# Patient Record
Sex: Female | Born: 1975 | Race: White | Hispanic: No | Marital: Married | Smoking: Never smoker
Health system: Southern US, Community
[De-identification: ages and names within clinical notes are randomized; demographics above are authoritative.]

## PROBLEM LIST (undated history)

## (undated) DIAGNOSIS — Z9889 Other specified postprocedural states: Secondary | ICD-10-CM

## (undated) DIAGNOSIS — J45909 Unspecified asthma, uncomplicated: Secondary | ICD-10-CM

## (undated) DIAGNOSIS — F329 Major depressive disorder, single episode, unspecified: Secondary | ICD-10-CM

## (undated) DIAGNOSIS — D509 Iron deficiency anemia, unspecified: Secondary | ICD-10-CM

## (undated) DIAGNOSIS — F32A Depression, unspecified: Secondary | ICD-10-CM

## (undated) DIAGNOSIS — T7840XA Allergy, unspecified, initial encounter: Secondary | ICD-10-CM

## (undated) DIAGNOSIS — E161 Other hypoglycemia: Secondary | ICD-10-CM

## (undated) DIAGNOSIS — E162 Hypoglycemia, unspecified: Secondary | ICD-10-CM

## (undated) DIAGNOSIS — R112 Nausea with vomiting, unspecified: Secondary | ICD-10-CM

## (undated) DIAGNOSIS — E039 Hypothyroidism, unspecified: Secondary | ICD-10-CM

## (undated) HISTORY — DX: Unspecified asthma, uncomplicated: J45.909

## (undated) HISTORY — DX: Hypothyroidism, unspecified: E03.9

## (undated) HISTORY — DX: Major depressive disorder, single episode, unspecified: F32.9

## (undated) HISTORY — DX: Iron deficiency anemia, unspecified: D50.9

## (undated) HISTORY — DX: Depression, unspecified: F32.A

## (undated) HISTORY — PX: GASTRIC BYPASS: SHX52

## (undated) HISTORY — DX: Hypoglycemia, unspecified: E16.2

## (undated) HISTORY — DX: Allergy, unspecified, initial encounter: T78.40XA

## (undated) HISTORY — DX: Other hypoglycemia: E16.1

---

## 2005-01-12 ENCOUNTER — Ambulatory Visit: Payer: Self-pay | Admitting: Family Medicine

## 2005-01-14 ENCOUNTER — Ambulatory Visit: Payer: Self-pay | Admitting: Family Medicine

## 2005-02-01 ENCOUNTER — Ambulatory Visit: Payer: Self-pay | Admitting: Family Medicine

## 2005-04-29 ENCOUNTER — Ambulatory Visit: Payer: Self-pay | Admitting: Unknown Physician Specialty

## 2006-12-19 ENCOUNTER — Encounter: Payer: Self-pay | Admitting: Maternal & Fetal Medicine

## 2007-06-20 ENCOUNTER — Ambulatory Visit: Payer: Self-pay

## 2007-06-21 ENCOUNTER — Inpatient Hospital Stay: Payer: Self-pay

## 2009-02-24 ENCOUNTER — Encounter: Payer: Self-pay | Admitting: Obstetrics and Gynecology

## 2009-04-10 ENCOUNTER — Observation Stay: Payer: Self-pay

## 2009-04-14 ENCOUNTER — Observation Stay: Payer: Self-pay

## 2009-04-17 ENCOUNTER — Observation Stay: Payer: Self-pay | Admitting: Unknown Physician Specialty

## 2009-04-21 ENCOUNTER — Encounter: Payer: Self-pay | Admitting: Obstetrics and Gynecology

## 2009-04-21 ENCOUNTER — Observation Stay: Payer: Self-pay

## 2009-04-24 ENCOUNTER — Observation Stay: Payer: Self-pay

## 2009-04-28 ENCOUNTER — Ambulatory Visit: Payer: Self-pay

## 2009-04-29 ENCOUNTER — Inpatient Hospital Stay: Payer: Self-pay

## 2009-09-04 ENCOUNTER — Ambulatory Visit: Payer: Self-pay | Admitting: Family Medicine

## 2009-09-04 DIAGNOSIS — M25569 Pain in unspecified knee: Secondary | ICD-10-CM

## 2009-09-04 DIAGNOSIS — E039 Hypothyroidism, unspecified: Secondary | ICD-10-CM | POA: Insufficient documentation

## 2009-09-04 DIAGNOSIS — F325 Major depressive disorder, single episode, in full remission: Secondary | ICD-10-CM

## 2009-09-04 DIAGNOSIS — J301 Allergic rhinitis due to pollen: Secondary | ICD-10-CM

## 2009-09-04 DIAGNOSIS — F331 Major depressive disorder, recurrent, moderate: Secondary | ICD-10-CM | POA: Insufficient documentation

## 2009-09-04 DIAGNOSIS — D509 Iron deficiency anemia, unspecified: Secondary | ICD-10-CM

## 2009-09-04 DIAGNOSIS — J45909 Unspecified asthma, uncomplicated: Secondary | ICD-10-CM | POA: Insufficient documentation

## 2009-09-23 ENCOUNTER — Encounter: Payer: Self-pay | Admitting: Family Medicine

## 2009-10-20 ENCOUNTER — Ambulatory Visit: Payer: Self-pay | Admitting: Family Medicine

## 2009-10-20 DIAGNOSIS — J069 Acute upper respiratory infection, unspecified: Secondary | ICD-10-CM | POA: Insufficient documentation

## 2010-01-28 ENCOUNTER — Ambulatory Visit: Payer: Self-pay | Admitting: Family Medicine

## 2010-01-28 DIAGNOSIS — J019 Acute sinusitis, unspecified: Secondary | ICD-10-CM

## 2010-01-28 DIAGNOSIS — R51 Headache: Secondary | ICD-10-CM

## 2010-01-28 DIAGNOSIS — R519 Headache, unspecified: Secondary | ICD-10-CM | POA: Insufficient documentation

## 2010-02-10 ENCOUNTER — Ambulatory Visit: Payer: Self-pay

## 2011-01-05 NOTE — Assessment & Plan Note (Signed)
Summary: 9:15 ?SINUS INFECTION/CLE   Vital Signs:  Patient profile:   35 year old female Height:      68 inches Weight:      289.38 pounds BMI:     44.16 Temp:     97.8 degrees F oral Pulse rate:   72 / minute Pulse rhythm:   regular BP sitting:   104 / 72  (left arm) Cuff size:   large  Vitals Entered By: Linde Gillis CMA Duncan Dull) (January 28, 2010 9:36 AM) CC: ? sinus infection   History of Present Illness: 35 year old female:  Two weeks ago got a cold, then sunday head really started to hurt. Not helping.   No fever. Three little children.   R sided facial pain and ear  Acute Visit History:      The patient complains of cough, earache, headache, nasal discharge, and sinus problems.  These symptoms began 2 weeks ago.  She denies abdominal pain, chest pain, diarrhea, eye symptoms, fever, genitourinary symptoms, musculoskeletal symptoms, nausea, rash, and sore throat.        There is no history of wheezing, sleep interference, shortness of breath, respiratory retractions, tachypnea, cyanosis, or interference with oral intake associated with her cough.        The earache is located on the right side.  There have been 'cold' or URI symptoms associated with the earache.  There is no history of recent antibiotic usage or recurrent otitis media associated with the earache.        She complains of sinus pressure, teeth aching, ears being blocked, nasal congestion, purulent drainage, and frontal headache.  The patient has had a past history of sinusitis.        Urine output has been normal.  She is tolerating clear liquids.        Allergies (verified): No Known Drug Allergies  Past History:  Past medical, surgical, family and social histories (including risk factors) reviewed, and no changes noted (except as noted below).  Past Medical History: Reviewed history from 09/04/2009 and no changes required. Current Problems:  THYROID DISORDER (ICD-246.9) BLOOD TRANSFUSION WITHOUT  REPORTED DIAGNOSIS (ICD-V58.2) ALLERGIC RHINITIS DUE TO POLLEN (ICD-477.0) DEPRESSION (ICD-311) ASTHMA (ICD-493.90) ANEMIA (ICD-285.9)    Past Surgical History: Reviewed history from 09/04/2009 and no changes required. 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 Family History of Arthritis Family History Diabetes: MGM Family History Ovarian cancer  Father: limited contact mother: ovarian cancer 1 brother, 3 sisters: health PGM and MGM : Alzheimers  Social History: Reviewed history from 09/04/2009 and no changes required. Occupation: Conservation officer, historic buildings Married Never Smoked Alcohol use-no Drug use-no Regular exercise-no  Review of Systems       REVIEW OF SYSTEMS GEN: Acute illness details above. CV: No chest pain or SOB GI: No noted N or V Otherwise, pertinent positives and negatives are noted in the HPI.   Physical Exam  Additional Exam:  Gen: WDWN, NAD; alert,appropriate and cooperative throughout exam  HEENT: Normocephalic and atraumatic. Throat clear, w/o exudate, no LAD, R TM clear, L TM - good landmarks, No fluid present. rhinnorhea.  Left frontal and maxillary sinuses: Tender, essentially NT Right frontal and maxillary sinuses: Tender greatest at max  Neck: No ant or post LAD  CV: RRR, No M/G/R  Pulm: Breathing comfortably in no resp distress. no w/c/r  Abd: S,NT,ND,+BS  Extr: no c/c/e  Psych: full affect, pleasant  Impression & Recommendations:  Problem # 1:  SINUSITIS - ACUTE-NOS (ICD-461.9) Assessment New  Her updated medication list for this problem includes:    Amoxicillin 500 Mg Caps (Amoxicillin) .Marland KitchenMarland KitchenMarland KitchenMarland Kitchen 3 tabs by mouth two times a day for 10 days  Problem # 2:  HEADACHE (ICD-784.0) Assessment: New  Complete Medication List: 1)  P D Natal Vitamins/folic Acid Tabs (Prenatal multivit-min-fe-fa) .Marland Kitchen.. 1 tab by mouth daily 2)  Synthroid 50 Mcg Tabs  (Levothyroxine sodium) .... Take 1 tablet by mouth once a day 3)  Amoxicillin 500 Mg Caps (Amoxicillin) .... 3 tabs by mouth two times a day for 10 days 4)  Diflucan 150 Mg Tabs (Fluconazole) .... Once daily  Patient Instructions: 1)  AFRIN SPRAY 2)  SINUSITIS 3)  Sinuses are cavities in facial skeleton that drain to nose. Impaired drainage and obstruction of sinus passages main cause. 4)  Treatment: 5)  1. Take all Antibiotics 6)  2. Open nasal and sinus canals: Oral decongestant: Sudafed. (CAUTION IF HIGH BLOOD PRESSURE) 7)  3. Steam inhalation 8)  4. Humidifier in room 9)  5. Frequent nasal saline irrigation 10)  6. Moist heat compresses to face 11)  7. Tylenol or Ibuprofen for pain and fever, follow directions on bottle.  Prescriptions: DIFLUCAN 150 MG TABS (FLUCONAZOLE) once daily  #1 x 0   Entered and Authorized by:   Hannah Beat MD   Signed by:   Hannah Beat MD on 01/28/2010   Method used:   Electronically to        [facility]       [address]       [place], [place]  [address]       Ph: [phone]       Fax: [phone]   RxID:   [id number] AMOXICILLIN 500 MG CAPS (AMOXICILLIN) 3 tabs by mouth two times a day for 10 days  #60 x 0   Entered and Authorized by:   Hannah Beat MD   Signed by:   Hannah Beat MD on 01/28/2010   Method used:   Electronically to        [facility]       [address]       [place], [place]  [address]       Ph: [phone]       Fax: [phone]   RxID:   [id number]   Current Allergies (reviewed today): No known allergies

## 2012-07-11 ENCOUNTER — Ambulatory Visit: Payer: Self-pay | Admitting: Internal Medicine

## 2012-07-11 LAB — CBC CANCER CENTER
Basophil #: 0 x10 3/mm (ref 0.0–0.1)
Basophil %: 0.7 %
Eosinophil #: 0.1 x10 3/mm (ref 0.0–0.7)
Eosinophil %: 1.2 %
HCT: 27.6 % — ABNORMAL LOW (ref 35.0–47.0)
HGB: 8.4 g/dL — ABNORMAL LOW (ref 12.0–16.0)
Lymphocyte #: 1.2 x10 3/mm (ref 1.0–3.6)
Lymphocyte %: 23.9 %
MCH: 19.4 pg — ABNORMAL LOW (ref 26.0–34.0)
MCHC: 30.3 g/dL — ABNORMAL LOW (ref 32.0–36.0)
MCV: 64 fL — ABNORMAL LOW (ref 80–100)
Monocyte #: 0.3 x10 3/mm (ref 0.2–0.9)
Monocyte %: 6.4 %
Neutrophil #: 3.5 x10 3/mm (ref 1.4–6.5)
Neutrophil %: 67.8 %
Platelet: 293 x10 3/mm (ref 150–440)
RBC: 4.31 10*6/uL (ref 3.80–5.20)
RDW: 20.8 % — ABNORMAL HIGH (ref 11.5–14.5)
WBC: 5.1 x10 3/mm (ref 3.6–11.0)

## 2012-07-11 LAB — IRON AND TIBC
Iron Bind.Cap.(Total): 422 ug/dL (ref 250–450)
Iron Saturation: 16 %
Iron: 66 ug/dL (ref 50–170)

## 2012-07-11 LAB — RETICULOCYTES
Absolute Retic Count: 0.055 10*6/uL (ref 0.023–0.096)
Reticulocyte: 1.3 % (ref 0.5–2.2)

## 2012-07-11 LAB — FERRITIN: Ferritin (ARMC): 2 ng/mL — ABNORMAL LOW (ref 8–388)

## 2012-08-06 ENCOUNTER — Ambulatory Visit: Payer: Self-pay | Admitting: Internal Medicine

## 2012-08-21 LAB — OCCULT BLOOD X 1 CARD TO LAB, STOOL
Occult Blood, Feces: NEGATIVE
Occult Blood, Feces: NEGATIVE
Occult Blood, Feces: NEGATIVE

## 2012-09-05 ENCOUNTER — Ambulatory Visit: Payer: Self-pay | Admitting: Internal Medicine

## 2012-10-06 ENCOUNTER — Ambulatory Visit: Payer: Self-pay | Admitting: Internal Medicine

## 2014-02-27 ENCOUNTER — Ambulatory Visit (INDEPENDENT_AMBULATORY_CARE_PROVIDER_SITE_OTHER): Payer: 59 | Admitting: Podiatry

## 2014-02-27 ENCOUNTER — Encounter: Payer: Self-pay | Admitting: Podiatry

## 2014-02-27 VITALS — BP 116/69 | HR 64 | Resp 16 | Ht 67.0 in | Wt 325.0 lb

## 2014-02-27 DIAGNOSIS — L608 Other nail disorders: Secondary | ICD-10-CM

## 2014-02-27 NOTE — Progress Notes (Signed)
Want him to take a look at my toenails , i noticed after my last pedicure. The toenails are discolored. Right great kind of hurts. Tried over the counter stuff and vicks vapor rub.  Objective: Vital signs are stable she is alert and oriented x3. Pulses are palpable bilateral. She has thickening and discoloration of nails 1 through 5 of the right foot. None the left foot.  Assessment: Nail dystrophy right foot.  Plan: We took clippings today and since it out for pathology and culture. I will followup with her once those come in.

## 2014-04-11 ENCOUNTER — Ambulatory Visit: Payer: Self-pay | Admitting: Internal Medicine

## 2014-04-25 ENCOUNTER — Emergency Department: Payer: Self-pay | Admitting: Emergency Medicine

## 2014-04-25 LAB — TROPONIN I: Troponin-I: <0.02 ng/mL

## 2014-04-25 LAB — CBC WITH DIFFERENTIAL/PLATELET
BASOS ABS: 0 10*3/uL (ref 0.0–0.1)
Basophil %: 0.7 %
EOS ABS: 0.2 10*3/uL (ref 0.0–0.7)
EOS PCT: 2.9 %
HCT: 34.2 % — AB (ref 35.0–47.0)
HGB: 10.3 g/dL — AB (ref 12.0–16.0)
Lymphocyte #: 2.1 10*3/uL (ref 1.0–3.6)
Lymphocyte %: 32.7 %
MCH: 21.3 pg — AB (ref 26.0–34.0)
MCHC: 30.2 g/dL — AB (ref 32.0–36.0)
MCV: 71 fL — AB (ref 80–100)
MONO ABS: 0.4 x10 3/mm (ref 0.2–0.9)
Monocyte %: 6.8 %
Neutrophil #: 3.6 10*3/uL (ref 1.4–6.5)
Neutrophil %: 56.9 %
PLATELETS: 264 10*3/uL (ref 150–440)
RBC: 4.85 10*6/uL (ref 3.80–5.20)
RDW: 26.3 % — AB (ref 11.5–14.5)
WBC: 6.4 10*3/uL (ref 3.6–11.0)

## 2014-04-25 LAB — BASIC METABOLIC PANEL
Anion Gap: 9 (ref 7–16)
BUN: 11 mg/dL (ref 7–18)
CREATININE: 0.67 mg/dL (ref 0.60–1.30)
Calcium, Total: 8.1 mg/dL — ABNORMAL LOW (ref 8.5–10.1)
Chloride: 106 mmol/L (ref 98–107)
Co2: 22 mmol/L (ref 21–32)
EGFR (African American): >60
EGFR (Non-African Amer.): >60
GLUCOSE: 90 mg/dL (ref 65–99)
Osmolality: 273 (ref 275–301)
Potassium: 4.7 mmol/L (ref 3.5–5.1)
SODIUM: 137 mmol/L (ref 136–145)

## 2014-04-25 LAB — URINALYSIS, COMPLETE
BLOOD: NEGATIVE
Bilirubin,UR: NEGATIVE
Glucose,UR: NEGATIVE mg/dL (ref 0–75)
Ketone: NEGATIVE
NITRITE: NEGATIVE
Ph: 7 (ref 4.5–8.0)
Protein: NEGATIVE
SPECIFIC GRAVITY: 1.011 (ref 1.003–1.030)
WBC UR: 13 /HPF (ref 0–5)

## 2014-04-25 LAB — CREATININE, SERUM: Creatine, Serum: 0.67

## 2014-05-06 ENCOUNTER — Ambulatory Visit: Payer: Self-pay | Admitting: Internal Medicine

## 2014-05-20 ENCOUNTER — Encounter: Payer: Self-pay | Admitting: Podiatry

## 2014-05-20 ENCOUNTER — Ambulatory Visit (INDEPENDENT_AMBULATORY_CARE_PROVIDER_SITE_OTHER): Payer: 59 | Admitting: Podiatry

## 2014-05-20 ENCOUNTER — Ambulatory Visit (INDEPENDENT_AMBULATORY_CARE_PROVIDER_SITE_OTHER): Payer: 59

## 2014-05-20 DIAGNOSIS — Z79899 Other long term (current) drug therapy: Secondary | ICD-10-CM

## 2014-05-20 DIAGNOSIS — M795 Residual foreign body in soft tissue: Secondary | ICD-10-CM

## 2014-05-20 LAB — CBC WITH DIFFERENTIAL/PLATELET
BASOS: 1 %
Basophils Absolute: 0 10*3/uL (ref 0.0–0.2)
EOS: 3 %
Eosinophils Absolute: 0.1 10*3/uL (ref 0.0–0.4)
HEMATOCRIT: 36 % (ref 34.0–46.6)
Hemoglobin: 11.3 g/dL (ref 11.1–15.9)
IMMATURE GRANS (ABS): 0 10*3/uL (ref 0.0–0.1)
IMMATURE GRANULOCYTES: 0 %
Lymphocytes Absolute: 1.2 10*3/uL (ref 0.7–3.1)
Lymphs: 26 %
MCH: 23.4 pg — ABNORMAL LOW (ref 26.6–33.0)
MCHC: 31.4 g/dL — ABNORMAL LOW (ref 31.5–35.7)
MCV: 75 fL — AB (ref 79–97)
MONOCYTES: 7 %
MONOS ABS: 0.3 10*3/uL (ref 0.1–0.9)
NEUTROS PCT: 63 %
Neutrophils Absolute: 2.9 10*3/uL (ref 1.4–7.0)
RBC: 4.83 x10E6/uL (ref 3.77–5.28)
RDW: 26.1 % — ABNORMAL HIGH (ref 12.3–15.4)
WBC: 4.5 10*3/uL (ref 3.4–10.8)

## 2014-05-20 MED ORDER — TERBINAFINE HCL 250 MG PO TABS: 250.0000 mg | ORAL_TABLET | Freq: Every day | ORAL | Status: DC

## 2014-05-20 NOTE — Progress Notes (Signed)
She presents today for followup of her fungal results. She's also complaining of a foreign object in the plantar aspect of her left foot. She states this been there for the past 2-3 days since be getting worse. She tried getting it out herself but she can't tolerate she states.  Objective: Vital signs are stable she is alert and oriented x3. Culture came back positive for fungus. Plantar aspect of the left foot does demonstrate an area of erythema with a central pustule. I evacuated the pustule today and debris the area deep to it and found a hair from a dog..  Assessment: Onychomycosis. Foreign body left foot.  Plan: Removal of foreign body today after Betadine skin prep and no local anesthetic was administered. Dispensed requisition for liver profile and CBC. Wrote a prescription for Lamisil 250 mg #30 one by mouth daily discussed the pros and cons of the use of Lamisil will followup with me in one month.

## 2014-05-21 LAB — HEPATIC FUNCTION PANEL
ALBUMIN: 3.8 g/dL (ref 3.5–5.5)
ALT: 9 IU/L (ref 0–32)
AST: 14 IU/L (ref 0–40)
Alkaline Phosphatase: 62 IU/L (ref 39–117)
BILIRUBIN TOTAL: 0.5 mg/dL (ref 0.0–1.2)
Bilirubin, Direct: 0.12 mg/dL (ref 0.00–0.40)
TOTAL PROTEIN: 6.4 g/dL (ref 6.0–8.5)

## 2014-05-22 ENCOUNTER — Telehealth: Payer: Self-pay | Admitting: *Deleted

## 2014-05-22 NOTE — Telephone Encounter (Signed)
Message copied by Bing ReeGUNN, Kristen Fromm L on Wed May 22, 2014  4:24 PM ------      Message from: Ernestene KielHYATT, MAX T      Created: Wed May 22, 2014 12:36 PM       Blood work is generally good and may continue with medication. ------

## 2014-05-22 NOTE — Telephone Encounter (Signed)
Called patient to inform her of her results, which was fine. Continue with medication

## 2014-05-24 LAB — OCCULT BLOOD X 1 CARD TO LAB, STOOL
Occult Blood, Feces: NEGATIVE
Occult Blood, Feces: NEGATIVE
Occult Blood, Feces: NEGATIVE

## 2014-06-05 ENCOUNTER — Ambulatory Visit: Payer: Self-pay | Admitting: Internal Medicine

## 2014-06-17 ENCOUNTER — Encounter: Payer: Self-pay | Admitting: Podiatry

## 2014-06-17 ENCOUNTER — Ambulatory Visit (INDEPENDENT_AMBULATORY_CARE_PROVIDER_SITE_OTHER): Payer: 59 | Admitting: Podiatry

## 2014-06-17 DIAGNOSIS — Z79899 Other long term (current) drug therapy: Secondary | ICD-10-CM

## 2014-06-17 MED ORDER — TERBINAFINE HCL 250 MG PO TABS: 250.0000 mg | ORAL_TABLET | Freq: Every day | ORAL | Status: DC

## 2014-06-17 NOTE — Progress Notes (Signed)
She presents today for followup of her Lamisil therapy. States she's been on for 1 month has denies fever chills nausea vomiting muscle aches pains and rashes from the medication.  Objective: Vital signs are stable she is alert and oriented x3 there is no erythema edema saline is drainage or odor. Nails do not appear to be changing as of yet.  Assessment: Onychomycosis  Plan: Another liver profile be performed today CBC. Prescription for 90 Lamisil tablets. I will followup with her in 4 months

## 2014-06-18 LAB — HEPATIC FUNCTION PANEL
ALT: 11 IU/L (ref 0–32)
AST: 15 IU/L (ref 0–40)
Albumin: 3.8 g/dL (ref 3.5–5.5)
Alkaline Phosphatase: 58 IU/L (ref 39–117)
BILIRUBIN DIRECT: 0.04 mg/dL (ref 0.00–0.40)
BILIRUBIN TOTAL: 0.3 mg/dL (ref 0.0–1.2)
Total Protein: 6.7 g/dL (ref 6.0–8.5)

## 2014-06-18 LAB — CBC WITH DIFFERENTIAL/PLATELET
BASOS ABS: 0 10*3/uL (ref 0.0–0.2)
Basos: 0 %
Eos: 3 %
Eosinophils Absolute: 0.2 10*3/uL (ref 0.0–0.4)
HEMATOCRIT: 37.5 % (ref 34.0–46.6)
HEMOGLOBIN: 11.8 g/dL (ref 11.1–15.9)
Immature Grans (Abs): 0 10*3/uL (ref 0.0–0.1)
Immature Granulocytes: 0 %
LYMPHS ABS: 1.8 10*3/uL (ref 0.7–3.1)
Lymphs: 36 %
MCH: 24.8 pg — ABNORMAL LOW (ref 26.6–33.0)
MCHC: 31.5 g/dL (ref 31.5–35.7)
MCV: 79 fL (ref 79–97)
Monocytes Absolute: 0.3 10*3/uL (ref 0.1–0.9)
Monocytes: 7 %
NEUTROS ABS: 2.7 10*3/uL (ref 1.4–7.0)
Neutrophils Relative %: 54 %
RBC: 4.76 x10E6/uL (ref 3.77–5.28)
RDW: 23.1 % — AB (ref 12.3–15.4)
WBC: 5 10*3/uL (ref 3.4–10.8)

## 2014-06-19 ENCOUNTER — Telehealth: Payer: Self-pay | Admitting: *Deleted

## 2014-06-19 NOTE — Telephone Encounter (Signed)
Called patient to let her know of her blood work results , left her a message

## 2014-06-19 NOTE — Telephone Encounter (Signed)
Message copied by Bing ReeGUNN, Ebony Yorio L on Wed Jun 19, 2014 11:54 AM ------      Message from: Ernestene KielHYATT, MAX T      Created: Tue Jun 18, 2014  7:30 AM       Blood work looks good.  Continue medication. ------

## 2014-07-03 ENCOUNTER — Encounter: Payer: Self-pay | Admitting: Podiatry

## 2014-10-14 ENCOUNTER — Ambulatory Visit: Payer: 59 | Admitting: Podiatry

## 2014-10-17 ENCOUNTER — Ambulatory Visit: Payer: Self-pay | Admitting: Internal Medicine

## 2014-11-05 ENCOUNTER — Ambulatory Visit: Payer: Self-pay | Admitting: Internal Medicine

## 2014-12-25 ENCOUNTER — Ambulatory Visit: Payer: Self-pay | Admitting: Internal Medicine

## 2014-12-31 ENCOUNTER — Ambulatory Visit: Payer: Self-pay | Admitting: Family Medicine

## 2015-01-07 ENCOUNTER — Encounter: Payer: Self-pay | Admitting: Family Medicine

## 2015-01-07 ENCOUNTER — Ambulatory Visit (INDEPENDENT_AMBULATORY_CARE_PROVIDER_SITE_OTHER): Payer: 59 | Admitting: Family Medicine

## 2015-01-07 ENCOUNTER — Encounter (INDEPENDENT_AMBULATORY_CARE_PROVIDER_SITE_OTHER): Payer: Self-pay

## 2015-01-07 VITALS — BP 96/70 | HR 60 | Temp 98.2°F | Ht 67.0 in | Wt 337.5 lb

## 2015-01-07 DIAGNOSIS — F3341 Major depressive disorder, recurrent, in partial remission: Secondary | ICD-10-CM

## 2015-01-07 DIAGNOSIS — E039 Hypothyroidism, unspecified: Secondary | ICD-10-CM

## 2015-01-07 DIAGNOSIS — D509 Iron deficiency anemia, unspecified: Secondary | ICD-10-CM

## 2015-01-07 MED ORDER — LEVOTHYROXINE SODIUM 100 MCG PO TABS: 100.0000 ug | ORAL_TABLET | Freq: Every day | ORAL | Status: DC

## 2015-01-07 NOTE — Assessment & Plan Note (Signed)
Follow once back on thyroid med. May need to treat.

## 2015-01-07 NOTE — Assessment & Plan Note (Signed)
Counseled on healthy eating, regular exercise

## 2015-01-07 NOTE — Patient Instructions (Addendum)
Restart levothyroxine, return for lab only visit for TSH in 2-4 weeks.  Get back on track with healthy eating and regular exercise. Schedule CPX with labs prior in 2-3 months.

## 2015-01-07 NOTE — Assessment & Plan Note (Signed)
Refill then re-eval on 100 mcg dose.

## 2015-01-07 NOTE — Progress Notes (Signed)
Pre visit review using our clinic review tool, if applicable. No additional management support is needed unless otherwise documented below in the visit note. 

## 2015-01-07 NOTE — Assessment & Plan Note (Signed)
Followed by heme.  

## 2015-01-07 NOTE — Progress Notes (Signed)
   Subjective:    Patient ID: Kelly BurkeSonja Z Barr, female    DOB: 04/02/1976, 39 y.o.   MRN: [id number]  HPI 39 year old female presetns to  Re-establish. Last seen here in 2010.[facility].  [facility] Dr. Dear.  Dure for CPX with pap/breast.  Hypothyroidism, poor control off medication in last few months: Due for re-eval and refill of med. She last had a change in med to increase last summer. Since being off noticed  10 lb weight gain and depression.  She is always cold.  She has been having iron def anemia: Seeing heme (Dr.Gittin) for iron infusions every 4 months.    Exercise: none  Diet: moderate  BP Readings from Last 3 Encounters:  01/07/15 96/70  02/27/14 116/69  01/28/10 104/72        Review of Systems  Constitutional: Negative for fever and fatigue.  HENT: Negative for ear pain.   Eyes: Negative for pain.  Respiratory: Negative for chest tightness and shortness of breath.   Cardiovascular: Negative for chest pain, palpitations and leg swelling.  Gastrointestinal: Negative for abdominal pain.  Genitourinary: Negative for dysuria.       Objective:   Physical Exam  Constitutional: Vital signs are normal. She appears well-developed and well-nourished. She is cooperative.  Non-toxic appearance. She does not appear ill. No distress.  Morbidly obese  HENT:  Head: Normocephalic.  Right Ear: Hearing, tympanic membrane, external ear and ear canal normal.  Left Ear: Hearing, tympanic membrane, external ear and ear canal normal.  Nose: Nose normal.  Eyes: Conjunctivae, EOM and lids are normal. Pupils are equal, round, and reactive to light. Lids are everted and swept, no foreign bodies found.  Neck: Trachea normal and normal range of motion. Neck supple. Carotid bruit is not present. No thyroid mass and no thyromegaly present.  Cardiovascular: Normal rate, regular rhythm, S1 normal, S2 normal, normal heart sounds and intact distal pulses.  Exam reveals no  gallop.   No murmur heard. Pulmonary/Chest: Effort normal and breath sounds normal. No respiratory distress. She has no wheezes. She has no rhonchi. She has no rales.  Abdominal: Soft. Normal appearance and bowel sounds are normal. She exhibits no distension, no fluid wave, no abdominal bruit and no mass. There is no hepatosplenomegaly. There is no tenderness. There is no rebound, no guarding and no CVA tenderness. No hernia.  Lymphadenopathy:    She has no cervical adenopathy.    She has no axillary adenopathy.  Neurological: She is alert. She has normal strength. No cranial nerve deficit or sensory deficit.  Skin: Skin is warm, dry and intact. No rash noted.  Psychiatric: Her speech is normal and behavior is normal. Judgment normal. Her mood appears not anxious. Cognition and memory are normal. She does not exhibit a depressed mood.          Assessment & Plan:

## 2015-02-20 ENCOUNTER — Encounter: Payer: Self-pay | Admitting: Family Medicine

## 2015-02-28 ENCOUNTER — Ambulatory Visit
Admit: 2015-02-28 | Disposition: A | Discharge status: Home / Self Care | Payer: Self-pay | Attending: Internal Medicine | Admitting: Internal Medicine

## 2015-03-07 ENCOUNTER — Ambulatory Visit
Admit: 2015-03-07 | Disposition: A | Discharge status: Home / Self Care | Payer: Self-pay | Attending: Internal Medicine | Admitting: Internal Medicine

## 2015-04-23 ENCOUNTER — Ambulatory Visit: Payer: 59 | Admitting: Internal Medicine

## 2015-04-29 ENCOUNTER — Encounter: Payer: 59 | Admitting: Family Medicine

## 2015-04-30 ENCOUNTER — Encounter: Payer: Self-pay | Admitting: Family Medicine

## 2015-05-06 ENCOUNTER — Ambulatory Visit (INDEPENDENT_AMBULATORY_CARE_PROVIDER_SITE_OTHER): Payer: 59 | Admitting: Family Medicine

## 2015-05-06 ENCOUNTER — Encounter: Payer: Self-pay | Admitting: Family Medicine

## 2015-05-06 VITALS — BP 110/72 | HR 60 | Temp 97.7°F | Ht 67.0 in | Wt 339.5 lb

## 2015-05-06 DIAGNOSIS — Z0000 Encounter for general adult medical examination without abnormal findings: Secondary | ICD-10-CM

## 2015-05-06 DIAGNOSIS — E039 Hypothyroidism, unspecified: Secondary | ICD-10-CM

## 2015-05-06 NOTE — Progress Notes (Signed)
Subjective:    Patient ID: Doreene BurkeSonja Z Shaw, female    DOB: 1976/07/15, 39 y.o.   MRN: [id number]  HPI  39 year old female presents for wellness visit.  Hypothyroid. Now on 100 mcg in last 3 months. 02/11/2015 free t3  and free t4 at goal. Less fatigue. No SE.   She received an iron transfusion in 02/2015 for anemia.  Hemetologist  [facility].  Plans repeat iron transfusion every 4 months.  Reviewed in detail. Recent CMET: glucose 91  Chol tot 150. hdl 57, LDL 84, trig 47  Wt Readings from Last 3 Encounters:  05/06/15 339 lb 8 oz (153.996 kg)  02/28/15 342 lb 2.5 oz (155.201 kg)  01/07/15 337 lb 8 oz (153.089 kg)   Diet: working on healthy eating. Snacks a lot, stress eating.  Exercise: Was doing well until feel 04/14/2015 when she fell. Has gradually improved. Trying to walk more with friend.   Asthma , mild intermittant: Well contorlle don no controller.   Review of Systems  Constitutional: Positive for fatigue. Negative for fever.  HENT: Negative for ear pain.   Eyes: Negative for pain.  Respiratory: Negative for chest tightness and shortness of breath.   Cardiovascular: Negative for chest pain, palpitations and leg swelling.  Gastrointestinal: Negative for abdominal pain.  Genitourinary: Negative for dysuria.       Objective:   Physical Exam  Constitutional: Vital signs are normal. She appears well-developed and well-nourished. She is cooperative.  Non-toxic appearance. She does not appear ill. No distress.  HENT:  Head: Normocephalic.  Right Ear: Hearing, tympanic membrane, external ear and ear canal normal.  Left Ear: Hearing, tympanic membrane, external ear and ear canal normal.  Nose: Nose normal.  Eyes: Conjunctivae, EOM and lids are normal. Pupils are equal, round, and reactive to light. Lids are everted and swept, no foreign bodies found.  Neck: Trachea normal and normal range of motion. Neck supple. Carotid bruit is not present. No thyroid mass and no  thyromegaly present.  Cardiovascular: Normal rate, regular rhythm, S1 normal, S2 normal, normal heart sounds and intact distal pulses.  Exam reveals no gallop.   No murmur heard. Pulmonary/Chest: Effort normal and breath sounds normal. No respiratory distress. She has no wheezes. She has no rhonchi. She has no rales.  Abdominal: Soft. Normal appearance and bowel sounds are normal. She exhibits no distension, no fluid wave, no abdominal bruit and no mass. There is no hepatosplenomegaly. There is no tenderness. There is no rebound, no guarding and no CVA tenderness. No hernia.  Genitourinary: Vagina normal and uterus normal. No breast swelling, tenderness, discharge or bleeding. Pelvic exam was performed with patient supine. There is no rash, tenderness or lesion on the right labia. There is no rash, tenderness or lesion on the left labia. Uterus is not enlarged and not tender. Right adnexum displays no mass, no tenderness and no fullness. Left adnexum displays no mass, no tenderness and no fullness.  NO PAP PERFORMED.  Lymphadenopathy:    She has no cervical adenopathy.    She has no axillary adenopathy.  Neurological: She is alert. She has normal strength. No cranial nerve deficit or sensory deficit.  Skin: Skin is warm, dry and intact. No rash noted.  Psychiatric: Her speech is normal and behavior is normal. Judgment normal. Her mood appears not anxious. Cognition and memory are normal. She does not exhibit a depressed mood.          Assessment & Plan:  The patient's preventative maintenance and recommended screening tests for an annual wellness exam were reviewed in full today. Brought up to date unless services declined.  Counselled on the importance of diet, exercise, and its role in overall health and mortality. The patient's FH and SH was reviewed, including their home life, tobacco status, and drug and alcohol status.   Vaccine: Uptodate PAP/DVE:  No history of abnormal pap, last  nml in 2015, repeat in 3 years, yearly DVE. No family history of early breast cancer. Body mass index is 53.16 kg/(m^2).  Nonsmoker.  STD testing: refused.

## 2015-05-06 NOTE — Patient Instructions (Signed)
Continue working on healthy eating, regular exercise and weight loss.

## 2015-05-06 NOTE — Progress Notes (Signed)
Pre visit review using our clinic review tool, if applicable. No additional management support is needed unless otherwise documented below in the visit note. 

## 2015-05-07 ENCOUNTER — Encounter: Payer: Self-pay | Admitting: Family Medicine

## 2015-05-26 ENCOUNTER — Inpatient Hospital Stay: Payer: 59 | Attending: Family Medicine

## 2015-05-26 VITALS — BP 104/71 | HR 65 | Temp 97.2°F | Resp 18

## 2015-05-26 DIAGNOSIS — D509 Iron deficiency anemia, unspecified: Secondary | ICD-10-CM | POA: Insufficient documentation

## 2015-05-26 MED ORDER — SODIUM CHLORIDE 0.9 % IV SOLN
INTRAVENOUS | Status: DC
  Administered 2015-05-26: 11:00:00 via INTRAVENOUS
  Filled 2015-05-26: qty 1000

## 2015-05-26 MED ORDER — SODIUM CHLORIDE 0.9 % IV SOLN
510.0000 mg | Freq: Once | INTRAVENOUS | Status: AC
  Administered 2015-05-26: 510 mg via INTRAVENOUS
  Filled 2015-05-26: qty 17

## 2015-06-19 ENCOUNTER — Inpatient Hospital Stay: Payer: 59 | Attending: Family Medicine | Admitting: Family Medicine

## 2015-06-19 ENCOUNTER — Inpatient Hospital Stay: Payer: 59

## 2015-06-30 ENCOUNTER — Ambulatory Visit: Payer: 59

## 2015-06-30 ENCOUNTER — Ambulatory Visit: Payer: 59 | Admitting: Hematology and Oncology

## 2015-07-10 ENCOUNTER — Ambulatory Visit: Payer: 59 | Admitting: Family Medicine

## 2015-07-10 ENCOUNTER — Ambulatory Visit: Payer: 59

## 2015-07-10 ENCOUNTER — Encounter: Payer: Self-pay | Admitting: Family Medicine

## 2015-07-17 ENCOUNTER — Ambulatory Visit: Payer: 59

## 2015-07-17 ENCOUNTER — Ambulatory Visit: Payer: 59 | Admitting: Family Medicine

## 2015-07-23 ENCOUNTER — Encounter: Payer: Self-pay | Admitting: Family Medicine

## 2015-07-23 ENCOUNTER — Inpatient Hospital Stay: Payer: 59 | Attending: Family Medicine | Admitting: Family Medicine

## 2015-07-23 ENCOUNTER — Inpatient Hospital Stay: Payer: 59

## 2015-07-23 VITALS — BP 113/77 | HR 88 | Temp 97.0°F | Wt 342.8 lb

## 2015-07-23 VITALS — BP 113/77 | HR 56

## 2015-07-23 DIAGNOSIS — E161 Other hypoglycemia: Secondary | ICD-10-CM | POA: Diagnosis not present

## 2015-07-23 DIAGNOSIS — J45909 Unspecified asthma, uncomplicated: Secondary | ICD-10-CM | POA: Diagnosis not present

## 2015-07-23 DIAGNOSIS — E039 Hypothyroidism, unspecified: Secondary | ICD-10-CM | POA: Insufficient documentation

## 2015-07-23 DIAGNOSIS — D509 Iron deficiency anemia, unspecified: Secondary | ICD-10-CM

## 2015-07-23 DIAGNOSIS — Z9884 Bariatric surgery status: Secondary | ICD-10-CM | POA: Diagnosis not present

## 2015-07-23 DIAGNOSIS — F329 Major depressive disorder, single episode, unspecified: Secondary | ICD-10-CM

## 2015-07-23 DIAGNOSIS — R5383 Other fatigue: Secondary | ICD-10-CM | POA: Insufficient documentation

## 2015-07-23 DIAGNOSIS — Z79899 Other long term (current) drug therapy: Secondary | ICD-10-CM | POA: Diagnosis not present

## 2015-07-23 MED ORDER — SODIUM CHLORIDE 0.9 % IV SOLN
510.0000 mg | Freq: Once | INTRAVENOUS | Status: AC
  Administered 2015-07-23: 510 mg via INTRAVENOUS
  Filled 2015-07-23: qty 17

## 2015-07-23 MED ORDER — SODIUM CHLORIDE 0.9 % IV SOLN
Freq: Once | INTRAVENOUS | Status: AC
  Administered 2015-07-23: 20 mL/h via INTRAVENOUS
  Filled 2015-07-23: qty 1000

## 2015-07-23 NOTE — Progress Notes (Signed)
[facility]  [phone]  [phone]     Kelly Barr DOB: 02-16-1976  MR#: [id number]  AOZ#:[id number]  Patient Care Team: Excell Seltzer, MD as PCP - General  CHIEF COMPLAINT:  Chief Complaint  Patient presents with  . Follow-up  . Anemia   Patient with significant past medical history of anemia, iron deficiency. Patient is status post gastric bypass, Roux-en-Y, surgery in 2006.   INT ERVAL HISTORY:  Patient is here for continued follow-up and treatment consideration regarding iron deficiency anemia. Patient is status post gastric bypass surgery in 2006 with a weight loss of approximately 180 pounds. She then conceived and carried 3 children and subsequently has gained a considerable amount of weight. She is here today for possible Feraheme and lab results. Labs were checked on 07/10/2015 through [facility]. Overall she feels very well other than fatigue. She states that she typically can tell when she is in need of Feraheme infusion due to increasing fatigue.   REVIEW OF SYSTEMS:   Review of Systems  Constitutional: Positive for malaise/fatigue. Negative for fever, chills, weight loss and diaphoresis.  HENT: Negative for congestion, ear discharge, ear pain, hearing loss, nosebleeds, sore throat and tinnitus.   Eyes: Negative for blurred vision, double vision, photophobia, pain, discharge and redness.  Respiratory: Negative for cough, hemoptysis, sputum production, shortness of breath, wheezing and stridor.   Cardiovascular: Negative for chest pain, palpitations, orthopnea, claudication, leg swelling and PND.  Gastrointestinal: Negative for heartburn, nausea, vomiting, abdominal pain, diarrhea, constipation, blood in stool and melena.  Genitourinary: Negative.   Musculoskeletal: Negative.   Skin: Negative.   Neurological: Negative for dizziness, tingling, focal weakness, seizures, weakness and headaches.  Endo/Heme/Allergies: Does not bruise/bleed  easily.  Psychiatric/Behavioral: Negative for depression. The patient is not nervous/anxious and does not have insomnia.     As per HPI. Otherwise, a complete review of systems is negatve.  ONCOLOGY HISTORY:  No history exists.    PAST MEDICAL HISTORY: Past Medical History  Diagnosis Date  . Hypothyroidism   . Allergy   . Asthma   . Depression   . Iron deficiency anemia   . Postprandial hypoglycemia     PAST SURGICAL HISTORY: Past Surgical History  Procedure Laterality Date  . Gastric bypass    . Cesarean section      FAMILY HISTORY Family History  Problem Relation Age of Onset  . Arthritis Mother   . Cervical cancer Mother   . Alcoholism Paternal Grandfather   . Stomach cancer Paternal Grandfather   . Heart disease Maternal Grandfather   . Diabetes Maternal Grandmother     GYNECOLOGIC HISTORY:  No LMP recorded.     ADVANCED DIRECTIVES:    HEALTH MAINTENANCE: Social History  Substance Use Topics  . Smoking status: Never Smoker   . Smokeless tobacco: Never Used  . Alcohol Use: No     Colonoscopy:  PAP:  Bone density:  Lipid panel:  Allergies  Allergen Reactions  . Morphine And Related Shortness Of Breath    rash  . Morphine Itching    Current Outpatient Prescriptions  Medication Sig Dispense Refill  . albuterol (PROVENTIL) (2.5 MG/3ML) 0.083% nebulizer solution Take 2.5 mg by nebulization every 6 (six) hours as needed for wheezing or shortness of breath.    . cetirizine (ZYRTEC) 10 MG tablet Take 10 mg by mouth daily.    . flintstones complete (FLINTSTONES) 60 MG chewable tablet Chew 2 tablets by mouth daily.    [facility]  levothyroxine (SYNTHROID, LEVOTHROID) 100 MCG tablet Take 1 tablet (100 mcg total) by mouth daily. 90 tablet 3   No current facility-administered medications for this visit.    OBJECTIVE: BP 113/77 mmHg  Pulse 88  Temp(Src) 97 F (36.1 C) (Tympanic)  Wt 342 lb 13 oz (155.5 kg)   Body mass index is 53.68 kg/(m^2).    ECOG FS:1 -  Symptomatic but completely ambulatory  General: Well-developed, well-nourished, no acute distress. Eyes: Pink conjunctiva, anicteric sclera. HEENT: Normocephalic, moist mucous membranes, clear oropharnyx. Lungs: Clear to auscultation bilaterally. Heart: Regular rate and rhythm. No rubs, murmurs, or gallops. Musculoskeletal: No edema, cyanosis, or clubbing. Neuro: Alert, answering all questions appropriately. Cranial nerves grossly intact. Skin: No rashes or petechiae noted. Psych: Normal affect.    LAB RESULTS:  No visits with results within 3 Day(s) from this visit. Latest known visit with results is:  Abstract on 03/28/2015  Component Date Value Ref Range Status  . Creatine, Serum 04/25/2014 0.67   Final    STUDIES: No results found.  ASSESSMENT:   Iron deficiency anemia  PLAN:   1. IDA. Labs reported from [facility] from 07/10/2015 serum ferritin 23, hemoglobin 13.9. Patient had previously had an established goals of ferritin greater than 50 by Dr. Lorre Nick. We'll proceed with 510 mg of Feraheme today. Patient will return to clinic in approximately 3 months for further evaluation.  Patient expressed understanding and was in agreement with this plan. She also understands that She can call clinic at any time with any questions, concerns, or complaints.   Dr. Doylene Canning was available for consultation and review of plan of care for this patient.   [name], NP   07/23/2015 2:25 PM

## 2015-08-28 ENCOUNTER — Ambulatory Visit (INDEPENDENT_AMBULATORY_CARE_PROVIDER_SITE_OTHER): Payer: 59 | Admitting: Family Medicine

## 2015-08-28 ENCOUNTER — Encounter: Payer: Self-pay | Admitting: Family Medicine

## 2015-08-28 VITALS — BP 120/70 | HR 66 | Temp 98.2°F | Wt 334.0 lb

## 2015-08-28 DIAGNOSIS — F3341 Major depressive disorder, recurrent, in partial remission: Secondary | ICD-10-CM | POA: Diagnosis not present

## 2015-08-28 DIAGNOSIS — F418 Other specified anxiety disorders: Secondary | ICD-10-CM | POA: Diagnosis not present

## 2015-08-28 MED ORDER — ESCITALOPRAM OXALATE 10 MG PO TABS: 10.0000 mg | ORAL_TABLET | Freq: Every day | ORAL | Status: DC

## 2015-08-28 MED ORDER — LORAZEPAM 0.5 MG PO TABS: 0.2500 mg | ORAL_TABLET | Freq: Four times a day (QID) | ORAL | Status: DC | PRN

## 2015-08-28 NOTE — Assessment & Plan Note (Signed)
Given her history and only partial improvement off med.. Will restart lexapro as well.

## 2015-08-28 NOTE — Progress Notes (Signed)
   Subjective:    Patient ID: Kelly Barr, female    DOB: 1976-08-08, 39 y.o.   MRN: [id number]  HPI  39 year old female presents as walk in for anxiety.  She reports that her husband had a stroke on 08/23/2015  and is on life support.  She is supposed to go today to have him " unplugged" as he is not expected to improve and did not wish to have longterm intubation.  She has been using melatonin to sleep.  She has been anxiety, panicky feeling, frequent waking, poor sleep.   She was on lexapro in past for depression.  Seen in ER for panic attack.. Given lorazepam.    Review of Systems  Constitutional: Negative for fever and fatigue.  HENT: Negative for ear pain.   Eyes: Negative for pain.  Respiratory: Negative for cough.   Cardiovascular: Negative for chest pain.       Objective:   Physical Exam  Constitutional: Vital signs are normal. She appears well-developed and well-nourished. She is cooperative.  Non-toxic appearance. She does not appear ill. No distress.  HENT:  Head: Normocephalic.  Right Ear: Hearing, tympanic membrane, external ear and ear canal normal. Tympanic membrane is not erythematous, not retracted and not bulging.  Left Ear: Hearing, tympanic membrane, external ear and ear canal normal. Tympanic membrane is not erythematous, not retracted and not bulging.  Nose: No mucosal edema or rhinorrhea. Right sinus exhibits no maxillary sinus tenderness and no frontal sinus tenderness. Left sinus exhibits no maxillary sinus tenderness and no frontal sinus tenderness.  Mouth/Throat: Uvula is midline, oropharynx is clear and moist and mucous membranes are normal.  Eyes: Conjunctivae, EOM and lids are normal. Pupils are equal, round, and reactive to light. Lids are everted and swept, no foreign bodies found.  Neck: Trachea normal and normal range of motion. Neck supple. Carotid bruit is not present. No thyroid mass and no thyromegaly present.  Cardiovascular: Normal rate,  regular rhythm, S1 normal, S2 normal, normal heart sounds, intact distal pulses and normal pulses.  Exam reveals no gallop and no friction rub.   No murmur heard. Pulmonary/Chest: Effort normal and breath sounds normal. No tachypnea. No respiratory distress. She has no decreased breath sounds. She has no wheezes. She has no rhonchi. She has no rales.  Abdominal: Soft. Normal appearance and bowel sounds are normal. There is no tenderness.  Neurological: She is alert.  Skin: Skin is warm, dry and intact. No rash noted.  Psychiatric: Her speech is normal. Judgment and thought content normal. Her mood appears not anxious. Her affect is blunt. She is slowed and withdrawn. Cognition and memory are normal. She exhibits a depressed mood.          Assessment & Plan:

## 2015-08-28 NOTE — Progress Notes (Signed)
Pre visit review using our clinic review tool, if applicable. No additional management support is needed unless otherwise documented below in the visit note. 

## 2015-08-28 NOTE — Assessment & Plan Note (Signed)
Will use lorazepam given anxiety, can use for sleep as well.   Will follow up closely. Recommended support group when ready.  Has very supportive sister.

## 2015-08-28 NOTE — Patient Instructions (Addendum)
Start lorazepam as needed for anxiety and panic every 6 hours as needed. Start lexapro at bedtime for depression.  Follow up in 2 weeks for follow up.

## 2015-09-09 ENCOUNTER — Ambulatory Visit (INDEPENDENT_AMBULATORY_CARE_PROVIDER_SITE_OTHER): Payer: 59 | Admitting: Family Medicine

## 2015-09-09 ENCOUNTER — Encounter: Payer: Self-pay | Admitting: Family Medicine

## 2015-09-09 VITALS — BP 118/80 | HR 60 | Temp 98.3°F | Ht 67.0 in | Wt 332.8 lb

## 2015-09-09 DIAGNOSIS — F331 Major depressive disorder, recurrent, moderate: Secondary | ICD-10-CM

## 2015-09-09 DIAGNOSIS — F418 Other specified anxiety disorders: Secondary | ICD-10-CM | POA: Diagnosis not present

## 2015-09-09 NOTE — Assessment & Plan Note (Signed)
Conitnue lexapro. Offered counseling, she will do [facility] with her 2 children. Close follow up.  She wants to try to go back to work to distract herself. She will contact me if she has difficulty.

## 2015-09-09 NOTE — Patient Instructions (Addendum)
Keep appt with [facility]. Continue lexapro daily. Call if having trouble with the return to work.

## 2015-09-09 NOTE — Progress Notes (Signed)
Pre visit review using our clinic review tool, if applicable. No additional management support is needed unless otherwise documented below in the visit note. 

## 2015-09-09 NOTE — Progress Notes (Signed)
   Subjective:    Patient ID: Kelly Barr, female    DOB: [birth year], 39 y.o.   MRN: [id number]  HPI   39 year old female pt presents for follow up situation anxiety and depression.  She lost her husband unexpectedly on 9/22. 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  She has minimal support at this time with her 2 kids given her family is out of town[place]ervation officer, [place]   She is sleeping some at night, wakes up after 5 hours.  She is having trouble focusing.  No SE.   She works at [employer].. She cannot go   Lorazepam makes her sleepy, she cannot take it durintg the day.  She is eating, moderately. No exercise.  She is starting [facility] counseling next week.   Review of Systems  Constitutional: Positive for fatigue. Negative for fever.  HENT: Negative for ear pain.   Eyes: Negative for pain.  Respiratory: Negative for shortness of breath.   Cardiovascular: Negative for chest pain.  Gastrointestinal: Negative for abdominal pain.  Psychiatric/Behavioral: Positive for sleep disturbance and decreased concentration. Negative for suicidal ideas and hallucinations.       Objective:   Physical Exam  Constitutional: Vital signs are normal. She appears well-developed and well-nourished. She is cooperative.  Non-toxic appearance. She does not appear ill. No distress.  HENT:  Head: Normocephalic.  Right Ear: Hearing and external ear normal.  Left Ear: Hearing and external ear normal.  Nose: No mucosal edema or rhinorrhea. Right sinus exhibits no maxillary sinus tenderness and no frontal sinus tenderness. Left sinus exhibits no maxillary sinus tenderness and no frontal sinus tenderness.  Mouth/Throat: Uvula is midline, oropharynx is clear and moist and mucous membranes are normal.  Eyes: Conjunctivae and lids are normal. Lids are everted and swept, no foreign bodies found.  Neck: Trachea normal and normal range  of motion. Neck supple. Carotid bruit is not present. No thyroid mass and no thyromegaly present.  Cardiovascular: Normal rate, regular rhythm, S1 normal, S2 normal, normal heart sounds, intact distal pulses and normal pulses.  Exam reveals no gallop and no friction rub.   No murmur heard. Pulmonary/Chest: Effort normal and breath sounds normal. No tachypnea. No respiratory distress. She has no decreased breath sounds. She has no wheezes. She has no rhonchi. She has no rales.  Abdominal: Soft. Normal appearance and bowel sounds are normal. There is no tenderness.  Neurological: She is alert.  Skin: Skin is warm, dry and intact. No rash noted.  Psychiatric: Her speech is normal. Judgment and thought content normal. Her mood appears not anxious. Her affect is blunt. She is slowed and withdrawn. Cognition and memory are normal. She exhibits a depressed mood. She expresses no homicidal and no suicidal ideation. She expresses no suicidal plans and no homicidal plans.          Assessment & Plan:

## 2015-09-09 NOTE — Assessment & Plan Note (Signed)
Improved. Not using lexapro as it makes her sleepy and she needs to care for her kids.

## 2015-09-11 ENCOUNTER — Ambulatory Visit: Payer: 59 | Admitting: Family Medicine

## 2015-09-23 ENCOUNTER — Ambulatory Visit: Payer: 59 | Admitting: Family Medicine

## 2015-10-17 ENCOUNTER — Encounter: Payer: Self-pay | Admitting: Family Medicine

## 2015-10-23 ENCOUNTER — Ambulatory Visit: Payer: 59

## 2015-10-23 ENCOUNTER — Inpatient Hospital Stay: Payer: 59 | Attending: Family Medicine

## 2015-10-23 ENCOUNTER — Inpatient Hospital Stay (HOSPITAL_BASED_OUTPATIENT_CLINIC_OR_DEPARTMENT_OTHER): Payer: 59 | Admitting: Family Medicine

## 2015-10-23 VITALS — BP 122/82 | HR 75 | Temp 95.8°F | Resp 18 | Ht 67.0 in | Wt 333.6 lb

## 2015-10-23 DIAGNOSIS — D509 Iron deficiency anemia, unspecified: Secondary | ICD-10-CM | POA: Diagnosis not present

## 2015-10-23 DIAGNOSIS — Z79899 Other long term (current) drug therapy: Secondary | ICD-10-CM | POA: Diagnosis not present

## 2015-10-23 DIAGNOSIS — E161 Other hypoglycemia: Secondary | ICD-10-CM | POA: Insufficient documentation

## 2015-10-23 DIAGNOSIS — F329 Major depressive disorder, single episode, unspecified: Secondary | ICD-10-CM | POA: Insufficient documentation

## 2015-10-23 DIAGNOSIS — Z9884 Bariatric surgery status: Secondary | ICD-10-CM | POA: Diagnosis not present

## 2015-10-23 DIAGNOSIS — E039 Hypothyroidism, unspecified: Secondary | ICD-10-CM

## 2015-10-23 DIAGNOSIS — J45909 Unspecified asthma, uncomplicated: Secondary | ICD-10-CM

## 2015-10-23 NOTE — Progress Notes (Signed)
Pt reports feeling better and having less fatigue

## 2015-10-23 NOTE — Progress Notes (Signed)
[facility]  Telephone:(336) [phone]  Fax:(336) [phone]     Kelly BurkeSonja Z Barr DOB: 22-May-1976  MR#: [id number]  XBM#:[id number]#:[id number]  Patient Care Team: Excell SeltzerAmy E Bedsole, MD as PCP - General  CHIEF COMPLAINT:  Chief Complaint  Patient presents with  . Follow-up    IDA   Patient with significant past medical history of anemia, iron deficiency. Patient is status post gastric bypass, Roux-en-Y, surgery in 2006.   INT ERVAL HISTORY:  Patient is here for continued follow-up and treatment consideration regarding iron deficiency anemia. Patient is status post gastric bypass surgery in 2006 with a weight loss of approximately 180 pounds. She then conceived and carried 3 children and subsequently has gained a considerable amount of weight. She is here today for possible Feraheme and lab results. Labs were checked on 10/17/2015 through [facility]. She reports emotional strain due to recent death of her 89 yo husband from massive stroke. She doesn't necessarily feel fatigued and has been functioning at her previous baseline.   REVIEW OF SYSTEMS:   Review of Systems  Constitutional: Negative for fever, chills, weight loss, malaise/fatigue and diaphoresis.  HENT: Negative for congestion, ear discharge, ear pain, hearing loss, nosebleeds, sore throat and tinnitus.   Eyes: Negative for blurred vision, double vision, photophobia, pain, discharge and redness.  Respiratory: Negative for cough, hemoptysis, sputum production, shortness of breath, wheezing and stridor.   Cardiovascular: Negative for chest pain, palpitations, orthopnea, claudication, leg swelling and PND.  Gastrointestinal: Negative for heartburn, nausea, vomiting, abdominal pain, diarrhea, constipation, blood in stool and melena.  Genitourinary: Negative.   Musculoskeletal: Negative.   Skin: Negative.   Neurological: Negative for dizziness, tingling, focal weakness, seizures, weakness and headaches.  Endo/Heme/Allergies: Does not  bruise/bleed easily.  Psychiatric/Behavioral: Positive for depression. The patient is not nervous/anxious and does not have insomnia.        Recently lost husband at age 39 to a massive stroke    As per HPI. Otherwise, a complete review of systems is negatve.   PAST MEDICAL HISTORY: Past Medical History  Diagnosis Date  . Hypothyroidism   . Allergy   . Asthma   . Depression   . Iron deficiency anemia   . Postprandial hypoglycemia     PAST SURGICAL HISTORY: Past Surgical History  Procedure Laterality Date  . Gastric bypass    . Cesarean section      FAMILY HISTORY Family History  Problem Relation Age of Onset  . Arthritis Mother   . Cervical cancer Mother   . Alcoholism Paternal Grandfather   . Stomach cancer Paternal Grandfather   . Heart disease Maternal Grandfather   . Diabetes Maternal Grandmother     GYNECOLOGIC HISTORY:  No LMP recorded.     ADVANCED DIRECTIVES:    HEALTH MAINTENANCE: Social History  Substance Use Topics  . Smoking status: Never Smoker   . Smokeless tobacco: Never Used  . Alcohol Use: No     Colonoscopy:  PAP:  Bone density:  Lipid panel:  Allergies  Allergen Reactions  . Morphine And Related Shortness Of Breath    rash  . Morphine Itching    Current Outpatient Prescriptions  Medication Sig Dispense Refill  . escitalopram (LEXAPRO) 10 MG tablet Take 1 tablet (10 mg total) by mouth daily. 30 tablet 5  . flintstones complete (FLINTSTONES) 60 MG chewable tablet Chew 2 tablets by mouth daily.    Marland Kitchen. levothyroxine (SYNTHROID, LEVOTHROID) 100 MCG tablet Take 1 tablet (100 mcg total) by mouth  daily. 90 tablet 3  . cetirizine (ZYRTEC) 10 MG tablet Take 10 mg by mouth daily as needed.     Marland Kitchen LORazepam (ATIVAN) 0.5 MG tablet Take 0.5-1 tablets (0.25-0.5 mg total) by mouth every 6 (six) hours as needed for anxiety. (Patient not taking: Reported on 10/23/2015) 30 tablet 1   No current facility-administered medications for this visit.     OBJECTIVE: BP 122/82 mmHg  Pulse 75  Temp(Src) 95.8 F (35.4 C) (Tympanic)  Resp 18  Ht  (1.702 m)  Wt 333 lb 8.9 oz (151.3 kg)  BMI 52.23 kg/m2   Body mass index is 52.23 kg/(m^2).    ECOG FS:0 - Asymptomatic  General: Well-developed, well-nourished, no acute distress. Eyes: Pink conjunctiva, anicteric sclera. HEENT: Normocephalic, moist mucous membranes, clear oropharnyx. Lungs: Clear to auscultation bilaterally. Heart: Regular rate and rhythm. No rubs, murmurs, or gallops. Musculoskeletal: No edema, cyanosis, or clubbing. Neuro: Alert, answering all questions appropriately. Cranial nerves grossly intact. Skin: No rashes or petechiae noted. Psych: Flat affect, depression related recent death of husband.    LAB RESULTS:  No visits with results within 3 Day(s) from this visit. Latest known visit with results is:  Abstract on 03/28/2015  Component Date Value Ref Range Status  . Creatine, Serum 04/25/2014 0.67   Final    STUDIES: No results found.  ASSESSMENT:   Iron deficiency anemia  PLAN:   1. IDA. Labs reported from [facility] from 10/17/2015 serum ferritin 45, hemoglobin 14.1. Patient had previously had an established goals of ferritin greater than 50 by Dr. Lorre Nick. She last received feraheme in August 2016. She currently does not feel as if she requires iron supplementation.  She continues with counseling regarding recent loss of husband.  Will continue with follow up again in approximately 8 weeks.   Patient expressed understanding and was in agreement with this plan. She also understands that She can call clinic at any time with any questions, concerns, or complaints.   Dr. Doylene Canning was available for consultation and review of plan of care for this patient.   [name], NP   10/23/2015 3:28 PM

## 2015-12-23 ENCOUNTER — Inpatient Hospital Stay: Payer: 59 | Admitting: Family Medicine

## 2015-12-23 ENCOUNTER — Inpatient Hospital Stay: Payer: 59

## 2015-12-29 ENCOUNTER — Other Ambulatory Visit: Payer: Self-pay | Admitting: Family Medicine

## 2016-01-01 ENCOUNTER — Inpatient Hospital Stay: Payer: 59 | Attending: Family Medicine

## 2016-01-01 ENCOUNTER — Inpatient Hospital Stay: Payer: 59 | Admitting: Family Medicine

## 2016-01-02 NOTE — Progress Notes (Signed)
This encounter was created in error - please disregard.

## 2016-03-14 ENCOUNTER — Other Ambulatory Visit: Payer: Self-pay | Admitting: Family Medicine

## 2016-03-23 ENCOUNTER — Telehealth: Payer: Self-pay | Admitting: *Deleted

## 2016-03-23 NOTE — Telephone Encounter (Signed)
Called requesting a [facility] form be sent to her for her upcoming appt on 4/26

## 2016-03-23 NOTE — Telephone Encounter (Signed)
Verlon AuLeslie- please fill out the [facility] form as requested by pt- Thx

## 2016-03-25 ENCOUNTER — Encounter: Payer: Self-pay | Admitting: Family Medicine

## 2016-03-31 ENCOUNTER — Inpatient Hospital Stay: Payer: 59

## 2016-03-31 ENCOUNTER — Inpatient Hospital Stay: Payer: 59 | Attending: Internal Medicine | Admitting: Internal Medicine

## 2016-03-31 VITALS — BP 113/74 | HR 76 | Resp 20

## 2016-03-31 VITALS — BP 125/82 | HR 77 | Temp 98.2°F

## 2016-03-31 DIAGNOSIS — Z9884 Bariatric surgery status: Secondary | ICD-10-CM

## 2016-03-31 DIAGNOSIS — J45909 Unspecified asthma, uncomplicated: Secondary | ICD-10-CM | POA: Diagnosis not present

## 2016-03-31 DIAGNOSIS — F329 Major depressive disorder, single episode, unspecified: Secondary | ICD-10-CM | POA: Insufficient documentation

## 2016-03-31 DIAGNOSIS — E039 Hypothyroidism, unspecified: Secondary | ICD-10-CM

## 2016-03-31 DIAGNOSIS — Z8049 Family history of malignant neoplasm of other genital organs: Secondary | ICD-10-CM | POA: Insufficient documentation

## 2016-03-31 DIAGNOSIS — D509 Iron deficiency anemia, unspecified: Secondary | ICD-10-CM | POA: Diagnosis not present

## 2016-03-31 DIAGNOSIS — E161 Other hypoglycemia: Secondary | ICD-10-CM | POA: Insufficient documentation

## 2016-03-31 DIAGNOSIS — R5383 Other fatigue: Secondary | ICD-10-CM | POA: Diagnosis not present

## 2016-03-31 DIAGNOSIS — Z79899 Other long term (current) drug therapy: Secondary | ICD-10-CM

## 2016-03-31 DIAGNOSIS — D508 Other iron deficiency anemias: Secondary | ICD-10-CM

## 2016-03-31 MED ORDER — SODIUM CHLORIDE 0.9 % IV SOLN
Freq: Once | INTRAVENOUS | Status: AC
  Administered 2016-03-31: 15:00:00 via INTRAVENOUS
  Filled 2016-03-31: qty 1000

## 2016-03-31 MED ORDER — FERUMOXYTOL INJECTION 510 MG/17 ML
510.0000 mg | Freq: Once | INTRAVENOUS | Status: AC
  Administered 2016-03-31: 510 mg via INTRAVENOUS
  Filled 2016-03-31: qty 17

## 2016-03-31 NOTE — Progress Notes (Signed)
[facility] OFFICE PROGRESS NOTE  Patient Care Team: Excell Seltzer, MD as PCP - General   SUMMARY OF ONCOLOGIC HISTORY:  # 2006- IRON DEFICIENCY ANEMIA sec Malabsorption/ gastric bypass, Roux-en-Y; IV Ferrahem  INTERVAL HISTORY: This is my first interaction with the patient since I joined the practice September 2016. I reviewed the patient's prior charts/pertinent labs in detail; findings are summarized above.   Patient complains of extreme fatigue. Denies any blood in stools or black stools. Denies any history of vaginal bleeding. No nausea no vomiting. No chest pain or shortness of breath. Patient lost her husband's stroke. Months ago.   REVIEW OF SYSTEMS:  A complete 10 point review of system is done which is negative except mentioned above/history of present illness.   PAST MEDICAL HISTORY :  Past Medical History  Diagnosis Date  . Hypothyroidism   . Allergy   . Asthma   . Depression   . Iron deficiency anemia   . Postprandial hypoglycemia     PAST SURGICAL HISTORY :   Past Surgical History  Procedure Laterality Date  . Gastric bypass    . Cesarean section      FAMILY HISTORY :   Family History  Problem Relation Age of Onset  . Arthritis Mother   . Cervical cancer Mother   . Alcoholism Paternal Grandfather   . Stomach cancer Paternal Grandfather   . Heart disease Maternal Grandfather   . Diabetes Maternal Grandmother     SOCIAL HISTORY:   Social History  Substance Use Topics  . Smoking status: Never Smoker   . Smokeless tobacco: Never Used  . Alcohol Use: No    ALLERGIES:  is allergic to morphine and related and morphine.  MEDICATIONS:  Current Outpatient Prescriptions  Medication Sig Dispense Refill  . cetirizine (ZYRTEC) 10 MG tablet Take 10 mg by mouth daily as needed.     Marland Kitchen escitalopram (LEXAPRO) 10 MG tablet TAKE 1 TABLET (10 MG TOTAL) BY MOUTH DAILY. 30 tablet 1  . flintstones complete (FLINTSTONES) 60 MG chewable tablet Chew 2  tablets by mouth daily.    Marland Kitchen levothyroxine (SYNTHROID, LEVOTHROID) 100 MCG tablet Take 1 tablet (100 mcg total) by mouth daily. 90 tablet 3   No current facility-administered medications for this visit.    PHYSICAL EXAMINATION: ECOG PERFORMANCE STATUS: 1 - Symptomatic but completely ambulatory  BP 125/82 mmHg  Pulse 77  Temp(Src) 98.2 F (36.8 C) (Tympanic)  There were no vitals filed for this visit.  GENERAL: Well-nourished well-developed; Alert, no distress and comfortable.   Alone. Obese.  EYES: no pallor or icterus OROPHARYNX: no thrush or ulceration; good dentition  NECK: supple, no masses felt LYMPH:  no palpable lymphadenopathy in the cervical, axillary or inguinal regions LUNGS: clear to auscultation and  No wheeze or crackles HEART/CVS: regular rate & rhythm and no murmurs; No lower extremity edema ABDOMEN:abdomen soft, non-tender and normal bowel sounds Musculoskeletal:no cyanosis of digits and no clubbing  PSYCH: alert & oriented x 3 with fluent speech NEURO: no focal motor/sensory deficits SKIN:  no rashes or significant lesions  LABORATORY DATA:  I have reviewed the data as listed    Component Value Date/Time   NA 137 04/25/2014 1534   K 4.7 04/25/2014 1534   CL 106 04/25/2014 1534   CO2 22 04/25/2014 1534   GLUCOSE 90 04/25/2014 1534   BUN 11 04/25/2014 1534   CREATININE 0.67 04/25/2014 1534   CALCIUM 8.1* 04/25/2014 1534   PROT 6.7  06/17/2014 0901   ALBUMIN 3.8 06/17/2014 0901   AST 15 06/17/2014 0901   ALT 11 06/17/2014 0901   ALKPHOS 58 06/17/2014 0901   BILITOT 0.3 06/17/2014 0901   GFRNONAA >60 04/25/2014 1534   GFRAA >60 04/25/2014 1534    No results found for: SPEP, UPEP  Lab Results  Component Value Date   WBC 5.0 06/17/2014   NEUTROABS 2.7 06/17/2014   HGB 11.8 06/17/2014   HCT 37.5 06/17/2014   MCV 79 06/17/2014   PLT 264 04/25/2014      Chemistry      Component Value Date/Time   NA 137 04/25/2014 1534   K 4.7 04/25/2014 1534    CL 106 04/25/2014 1534   CO2 22 04/25/2014 1534   BUN 11 04/25/2014 1534   CREATININE 0.67 04/25/2014 1534      Component Value Date/Time   CALCIUM 8.1* 04/25/2014 1534   ALKPHOS 58 06/17/2014 0901   AST 15 06/17/2014 0901   ALT 11 06/17/2014 0901   BILITOT 0.3 06/17/2014 0901         ASSESSMENT & PLAN:   # 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 Hemoglobin 14. Ferritin 13. Patient is fatigued. Patient feels improved after IV iron. Recommend IV Feraheme today  # Status post gastric bypass recheck CBC CMP ferritin iron studies B12 folic acid in 6 months.   # Patient will have labs in 3 [facility] in 6 months few days prior to next visit. She was asked to call us regarding infusion in 6 months.     Earna CoderGovinda R Lulu Hirschmann, MD 03/31/2016 2:55 PM

## 2016-05-05 ENCOUNTER — Telehealth: Payer: Self-pay | Admitting: Family Medicine

## 2016-05-05 NOTE — Telephone Encounter (Signed)
Pt has cpx 9/8 and wanted lab order sent to [facility] Please advise pt when this has been done

## 2016-05-06 ENCOUNTER — Other Ambulatory Visit: Payer: Self-pay | Admitting: Family Medicine

## 2016-05-06 DIAGNOSIS — Z13220 Encounter for screening for lipoid disorders: Secondary | ICD-10-CM

## 2016-05-06 DIAGNOSIS — E039 Hypothyroidism, unspecified: Secondary | ICD-10-CM

## 2016-05-06 NOTE — Telephone Encounter (Signed)
Kelly Barr notified lab orders have been entered into Epic.  Can go and have labs drawn at [facility] a [place] prior to her physical on 08/13/2016.

## 2016-05-06 NOTE — Telephone Encounter (Signed)
Order in outbox.

## 2016-05-07 ENCOUNTER — Encounter: Payer: 59 | Admitting: Family Medicine

## 2016-05-16 ENCOUNTER — Other Ambulatory Visit: Payer: Self-pay | Admitting: Family Medicine

## 2016-06-04 ENCOUNTER — Other Ambulatory Visit: Payer: Self-pay | Admitting: Nurse Practitioner

## 2016-08-12 LAB — T4, FREE: FREE T4: 0.72 ng/dL — AB (ref 0.82–1.77)

## 2016-08-12 LAB — COMPREHENSIVE METABOLIC PANEL
A/G RATIO: 1.3 (ref 1.2–2.2)
ALK PHOS: 62 IU/L (ref 39–117)
ALT: 11 IU/L (ref 0–32)
AST: 20 IU/L (ref 0–40)
Albumin: 3.8 g/dL (ref 3.5–5.5)
BILIRUBIN TOTAL: 0.5 mg/dL (ref 0.0–1.2)
BUN/Creatinine Ratio: 18 (ref 9–23)
BUN: 12 mg/dL (ref 6–20)
CALCIUM: 8.8 mg/dL (ref 8.7–10.2)
CHLORIDE: 103 mmol/L (ref 96–106)
CO2: 24 mmol/L (ref 18–29)
Creatinine, Ser: 0.68 mg/dL (ref 0.57–1.00)
GFR calc Af Amer: 127 mL/min/{1.73_m2} (ref >59)
GFR calc non Af Amer: 111 mL/min/{1.73_m2} (ref >59)
Globulin, Total: 3 g/dL (ref 1.5–4.5)
Glucose: 115 mg/dL — ABNORMAL HIGH (ref 65–99)
POTASSIUM: 4.9 mmol/L (ref 3.5–5.2)
Sodium: 140 mmol/L (ref 134–144)
Total Protein: 6.8 g/dL (ref 6.0–8.5)

## 2016-08-12 LAB — T3, FREE: T3 FREE: 2.5 pg/mL (ref 2.0–4.4)

## 2016-08-12 LAB — LIPID PANEL
CHOL/HDL RATIO: 2.9 ratio (ref 0.0–4.4)
Cholesterol, Total: 174 mg/dL (ref 100–199)
HDL: 60 mg/dL (ref >39)
LDL CALC: 99 mg/dL (ref 0–99)
TRIGLYCERIDES: 75 mg/dL (ref 0–149)
VLDL CHOLESTEROL CAL: 15 mg/dL (ref 5–40)

## 2016-08-12 LAB — TSH: TSH: 8.01 u[IU]/mL — ABNORMAL HIGH (ref 0.450–4.500)

## 2016-08-13 ENCOUNTER — Encounter: Payer: Self-pay | Admitting: Family Medicine

## 2016-08-13 ENCOUNTER — Telehealth: Payer: Self-pay | Admitting: *Deleted

## 2016-08-13 ENCOUNTER — Ambulatory Visit (INDEPENDENT_AMBULATORY_CARE_PROVIDER_SITE_OTHER): Payer: 59 | Admitting: Family Medicine

## 2016-08-13 VITALS — BP 120/80 | HR 74 | Temp 98.8°F | Ht 66.5 in | Wt 358.5 lb

## 2016-08-13 DIAGNOSIS — F331 Major depressive disorder, recurrent, moderate: Secondary | ICD-10-CM | POA: Diagnosis not present

## 2016-08-13 DIAGNOSIS — Z0000 Encounter for general adult medical examination without abnormal findings: Secondary | ICD-10-CM

## 2016-08-13 DIAGNOSIS — Z23 Encounter for immunization: Secondary | ICD-10-CM

## 2016-08-13 DIAGNOSIS — E039 Hypothyroidism, unspecified: Secondary | ICD-10-CM | POA: Diagnosis not present

## 2016-08-13 MED ORDER — LIRAGLUTIDE 18 MG/3ML ~~LOC~~ SOPN: 0.6000 mg | PEN_INJECTOR | Freq: Every day | SUBCUTANEOUS | 11 refills | Status: DC

## 2016-08-13 MED ORDER — LEVOTHYROXINE SODIUM 100 MCG PO TABS: 100.0000 ug | ORAL_TABLET | Freq: Every day | ORAL | 3 refills | Status: DC

## 2016-08-13 NOTE — Patient Instructions (Addendum)
Restart levothyroxine.  Return in 3 weeks for thyroid check with lab only.   If mood is not improved at that time.. Let me know for possible increase in lexapro. Work on increasing exercise and getting back to healthy diet.   Start victoza 0.6 mg daily.

## 2016-08-13 NOTE — Addendum Note (Signed)
Addended by: Damita LackLORING, DONNA S on: 08/13/2016 03:01 PM   Modules accepted: Orders

## 2016-08-13 NOTE — Progress Notes (Signed)
40 year old female presents for wellness visit.  5 days of right ear pain. Seen at [facility].. Started on cipro drops yesterday. No improving.  No fever. No ear discharge. Tender externally on right upper jaw.  Hypothyroid. She ran out and has not bee taking in last few months on 100 mcg.  Lab Results  Component Value Date   TSH 8.010 (H) 08/11/2016  free t3 nml and free t4 slightly low   She received an iron transfusion in 02/2015 for anemia.  Hemetologist  Dr. Neale Burly at [facility].  Plans repeat iron transfusion every 4 months.  Reviewed in detail.  Prediabetes glucose 115 LDL at goal at 99, HDL 60  Wt Readings from Last 3 Encounters:  08/13/16 (!) 358 lb 8 oz (162.6 kg)  10/23/15 (!) 333 lb 8.9 oz (151.3 kg)  09/09/15 (!) 332 lb 12 oz (150.9 kg)   Diet: poor  Exercise: None.   Depression, moderate: inadequate control . PHQ9:13 She feels like lexapro is helping a little.   Asthma , mild intermittant: Well controlled on no controller.  Social History /Family History/Past Medical History reviewed and updated if needed.  Review of Systems  Constitutional: Positive for fatigue. Negative for fever.  HENT: Negative for ear pain.   Eyes: Negative for pain.  Respiratory: Negative for chest tightness and shortness of breath.   Cardiovascular: Negative for chest pain, palpitations and leg swelling.  Gastrointestinal: Negative for abdominal pain.  Genitourinary: Negative for dysuria.       Objective:   Physical Exam  Constitutional: Vital signs are normal. She appears well-developed and well-nourished. She is cooperative.  Non-toxic appearance. She does not appear ill. No distress.  HENT:  Head: Normocephalic.  Right Ear: Hearing, tympanic membrane, external ear and ear canal normal.  Left Ear: Hearing, tympanic membrane, external ear and ear canal normal.  Nose: Nose normal.  Eyes: Conjunctivae, EOM and lids are normal. Pupils are equal, round, and reactive to light. Lids  are everted and swept, no foreign bodies found.  Neck: Trachea normal and normal range of motion. Neck supple. Carotid bruit is not present. No thyroid mass and no thyromegaly present.  Cardiovascular: Normal rate, regular rhythm, S1 normal, S2 normal, normal heart sounds and intact distal pulses.  Exam reveals no gallop.   No murmur heard. Pulmonary/Chest: Effort normal and breath sounds normal. No respiratory distress. She has no wheezes. She has no rhonchi. She has no rales.  Abdominal: Soft. Normal appearance and bowel sounds are normal. She exhibits no distension, no fluid wave, no abdominal bruit and no mass. There is no hepatosplenomegaly. There is no tenderness. There is no rebound, no guarding and no CVA tenderness. No hernia.  Genitourinary:  MENSES ONGOING: DEFERRED. NO PAP PERFORMED.  Lymphadenopathy:    She has no cervical adenopathy.    She has no axillary adenopathy.  Neurological: She is alert. She has normal strength. No cranial nerve deficit or sensory deficit.  Skin: Skin is warm, dry and intact. No rash noted.  Psychiatric: Her speech is normal and behavior is normal. Judgment normal. Her mood appears not anxious. Cognition and memory are normal. She does not exhibit a depressed mood.          Assessment & Plan:  The patient's preventative maintenance and recommended screening tests for an annual wellness exam were reviewed in full today. Brought up to date unless services declined.  Counselled on the importance of diet, exercise, and its role in overall health and mortality. The  patient's FH and SH was reviewed, including their home life, tobacco status, and drug and alcohol status.   Vaccine: Due for tdap. PAP/DVE:  No history of abnormal pap, last nml in 2015, repeat in 3 years given no HPV at last test, yearly DVE.  deferred given has menses. No family history of early breast cancer. Body mass index is 57 kg/m. Nonsmoker.  HIV testing: refused.

## 2016-08-13 NOTE — Assessment & Plan Note (Signed)
Past bypass. Discussed options. Start victoza, close follow up.

## 2016-08-13 NOTE — Addendum Note (Signed)
Addended by: Damita LackLORING, DONNA S on: 08/13/2016 04:34 PM   Modules accepted: Orders

## 2016-08-13 NOTE — Assessment & Plan Note (Addendum)
Inadequate control on current dose of lexapro. If not improving back on thyroid med , may need to increase.

## 2016-08-13 NOTE — Progress Notes (Signed)
Pre visit review using our clinic review tool, if applicable. No additional management support is needed unless otherwise documented below in the visit note. 

## 2016-08-13 NOTE — Telephone Encounter (Signed)
Received fax from [employer] requesting PA for Victoza.  PA completed on CoverMyMeds.  Under review.  Can take up to 72 hours for a decision.

## 2016-08-13 NOTE — Assessment & Plan Note (Signed)
Restart medication. Likely causing some of the weight gain and fatigue and mood issues .

## 2016-08-13 NOTE — Addendum Note (Signed)
Addended by: Damita LackLORING, DONNA S on: 08/13/2016 04:32 PM   Modules accepted: Orders

## 2016-08-15 ENCOUNTER — Other Ambulatory Visit: Payer: Self-pay | Admitting: Family Medicine

## 2016-08-16 NOTE — Telephone Encounter (Signed)
Let pt know med not covered for weight loss.  Conitnue lifestyle changes..Marland Kitchen

## 2016-08-16 NOTE — Telephone Encounter (Signed)
PA denied. Patient must try one generic alternative: Metformin Metformin extended release Glipizide-metformin Glyburide-metformin Pioglitazone-metformin  Or you can do an appeal.

## 2016-08-17 NOTE — Telephone Encounter (Signed)
Rayhana notified as instructed by telephone.

## 2016-09-30 ENCOUNTER — Inpatient Hospital Stay: Payer: 59 | Admitting: Internal Medicine

## 2016-09-30 ENCOUNTER — Inpatient Hospital Stay: Payer: 59

## 2016-10-14 ENCOUNTER — Inpatient Hospital Stay: Payer: 59

## 2016-10-14 ENCOUNTER — Inpatient Hospital Stay: Payer: 59 | Admitting: Internal Medicine

## 2016-11-01 ENCOUNTER — Ambulatory Visit: Payer: 59 | Admitting: Internal Medicine

## 2016-11-01 ENCOUNTER — Ambulatory Visit: Payer: 59

## 2016-11-02 ENCOUNTER — Encounter: Payer: Self-pay | Admitting: Internal Medicine

## 2016-11-05 ENCOUNTER — Inpatient Hospital Stay: Payer: 59 | Attending: Internal Medicine | Admitting: Internal Medicine

## 2016-11-05 ENCOUNTER — Inpatient Hospital Stay: Payer: 59

## 2016-11-05 VITALS — BP 109/74 | HR 74 | Temp 98.5°F | Wt 358.8 lb

## 2016-11-05 DIAGNOSIS — E039 Hypothyroidism, unspecified: Secondary | ICD-10-CM | POA: Diagnosis not present

## 2016-11-05 DIAGNOSIS — F329 Major depressive disorder, single episode, unspecified: Secondary | ICD-10-CM | POA: Diagnosis not present

## 2016-11-05 DIAGNOSIS — Z8049 Family history of malignant neoplasm of other genital organs: Secondary | ICD-10-CM

## 2016-11-05 DIAGNOSIS — K909 Intestinal malabsorption, unspecified: Secondary | ICD-10-CM | POA: Diagnosis not present

## 2016-11-05 DIAGNOSIS — E161 Other hypoglycemia: Secondary | ICD-10-CM | POA: Diagnosis not present

## 2016-11-05 DIAGNOSIS — R5383 Other fatigue: Secondary | ICD-10-CM

## 2016-11-05 DIAGNOSIS — Z800 Family history of malignant neoplasm of digestive organs: Secondary | ICD-10-CM | POA: Diagnosis not present

## 2016-11-05 DIAGNOSIS — Z9884 Bariatric surgery status: Secondary | ICD-10-CM | POA: Diagnosis not present

## 2016-11-05 DIAGNOSIS — K9589 Other complications of other bariatric procedure: Secondary | ICD-10-CM

## 2016-11-05 DIAGNOSIS — F418 Other specified anxiety disorders: Secondary | ICD-10-CM

## 2016-11-05 DIAGNOSIS — Z79899 Other long term (current) drug therapy: Secondary | ICD-10-CM | POA: Insufficient documentation

## 2016-11-05 DIAGNOSIS — D509 Iron deficiency anemia, unspecified: Secondary | ICD-10-CM | POA: Diagnosis not present

## 2016-11-05 DIAGNOSIS — R0602 Shortness of breath: Secondary | ICD-10-CM | POA: Diagnosis not present

## 2016-11-05 MED ORDER — SODIUM CHLORIDE 0.9 % IV SOLN
Freq: Once | INTRAVENOUS | Status: AC
  Administered 2016-11-05: 15:00:00 via INTRAVENOUS
  Filled 2016-11-05: qty 1000

## 2016-11-05 MED ORDER — SODIUM CHLORIDE 0.9 % IV SOLN
510.0000 mg | Freq: Once | INTRAVENOUS | Status: AC
  Administered 2016-11-05: 510 mg via INTRAVENOUS
  Filled 2016-11-05: qty 17

## 2016-11-05 NOTE — Progress Notes (Signed)
Patient is here for follow up, she is doing well she is getting a little tired

## 2016-11-05 NOTE — Progress Notes (Signed)
[facility] OFFICE PROGRESS NOTE  Patient Care Team: Excell SeltzerAmy E Bedsole, MD as PCP - General   SUMMARY OF ONCOLOGIC HISTORY:  # 2006- IRON DEFICIENCY ANEMIA sec Malabsorption/ gastric bypass, Roux-en-Y; IV Ferrahem  INTERVAL HISTORY:  Patient admits to mild shortness of breath with exertion.  Complains of fatigue on exertion. Denies any blood in stools or black stools. Denies any history of vaginal bleeding. No nausea no vomiting. No chest pain or shortness of breath.  REVIEW OF SYSTEMS:  A complete 10 point review of system is done which is negative except mentioned above/history of present illness.   PAST MEDICAL HISTORY :  Past Medical History:  Diagnosis Date  . Allergy   . Asthma   . Depression   . Hypothyroidism   . Iron deficiency anemia   . Postprandial hypoglycemia     PAST SURGICAL HISTORY :   Past Surgical History:  Procedure Laterality Date  . CESAREAN SECTION    . GASTRIC BYPASS      FAMILY HISTORY :   Family History  Problem Relation Age of Onset  . Arthritis Mother   . Cervical cancer Mother   . Alcoholism Paternal Grandfather   . Stomach cancer Paternal Grandfather   . Heart disease Maternal Grandfather   . Diabetes Maternal Grandmother     SOCIAL HISTORY:   Social History  Substance Use Topics  . Smoking status: Never Smoker  . Smokeless tobacco: Never Used  . Alcohol use No    ALLERGIES:  is allergic to morphine and related and morphine.  MEDICATIONS:  Current Outpatient Prescriptions  Medication Sig Dispense Refill  . cetirizine (ZYRTEC) 10 MG tablet Take 10 mg by mouth daily as needed.     Marland Kitchen. escitalopram (LEXAPRO) 10 MG tablet TAKE 1 TABLET (10 MG TOTAL) BY MOUTH DAILY. 90 tablet 1  . flintstones complete (FLINTSTONES) 60 MG chewable tablet Chew 2 tablets by mouth daily.    Marland Kitchen. levothyroxine (SYNTHROID, LEVOTHROID) 100 MCG tablet Take 1 tablet (100 mcg total) by mouth daily. 90 tablet 3  . clobetasol cream (TEMOVATE) 0.05 %      . Liraglutide 18 MG/3ML SOPN Inject 0.1 mLs (0.6 mg total) into the skin daily. (Patient not taking: Reported on 11/05/2016) 3 mL 11   No current facility-administered medications for this visit.     PHYSICAL EXAMINATION: ECOG PERFORMANCE STATUS: 1 - Symptomatic but completely ambulatory  BP 109/74 (BP Location: Left Arm, Patient Position: Sitting)   Pulse 74   Temp 98.5 F (36.9 C) (Tympanic)   Wt (!) 358 lb 12.8 oz (162.8 kg)   SpO2 98%   BMI 57.04 kg/m   Filed Weights   11/05/16 1413  Weight: (!) 358 lb 12.8 oz (162.8 kg)    GENERAL: Well-nourished well-developed; Alert, no distress and comfortable.   Alone. Obese.  EYES: no pallor or icterus OROPHARYNX: no thrush or ulceration; good dentition  NECK: supple, no masses felt LYMPH:  no palpable lymphadenopathy in the cervical, axillary or inguinal regions LUNGS: clear to auscultation and  No wheeze or crackles HEART/CVS: regular rate & rhythm and no murmurs; No lower extremity edema ABDOMEN:abdomen soft, non-tender and normal bowel sounds Musculoskeletal:no cyanosis of digits and no clubbing  PSYCH: alert & oriented x 3 with fluent speech NEURO: no focal motor/sensory deficits SKIN:  no rashes or significant lesions  LABORATORY DATA:  I have reviewed the data as listed    Component Value Date/Time   NA 140 08/11/2016 1559  NA 137 04/25/2014 1534   K 4.9 08/11/2016 1559   K 4.7 04/25/2014 1534   CL 103 08/11/2016 1559   CL 106 04/25/2014 1534   CO2 24 08/11/2016 1559   CO2 22 04/25/2014 1534   GLUCOSE 115 (H) 08/11/2016 1559   GLUCOSE 90 04/25/2014 1534   BUN 12 08/11/2016 1559   BUN 11 04/25/2014 1534   CREATININE 0.68 08/11/2016 1559   CREATININE 0.67 04/25/2014 1534   CALCIUM 8.8 08/11/2016 1559   CALCIUM 8.1 (L) 04/25/2014 1534   PROT 6.8 08/11/2016 1559   ALBUMIN 3.8 08/11/2016 1559   AST 20 08/11/2016 1559   ALT 11 08/11/2016 1559   ALKPHOS 62 08/11/2016 1559   BILITOT 0.5 08/11/2016 1559    GFRNONAA 111 08/11/2016 1559   GFRNONAA >60 04/25/2014 1534   GFRAA 127 08/11/2016 1559   GFRAA >60 04/25/2014 1534    No results found for: SPEP, UPEP  Lab Results  Component Value Date   WBC 5.0 06/17/2014   NEUTROABS 2.7 06/17/2014   HGB 11.8 06/17/2014   HCT 37.5 06/17/2014   MCV 79 06/17/2014   PLT 264 04/25/2014      Chemistry      Component Value Date/Time   NA 140 08/11/2016 1559   NA 137 04/25/2014 1534   K 4.9 08/11/2016 1559   K 4.7 04/25/2014 1534   CL 103 08/11/2016 1559   CL 106 04/25/2014 1534   CO2 24 08/11/2016 1559   CO2 22 04/25/2014 1534   BUN 12 08/11/2016 1559   BUN 11 04/25/2014 1534   CREATININE 0.68 08/11/2016 1559   CREATININE 0.67 04/25/2014 1534      Component Value Date/Time   CALCIUM 8.8 08/11/2016 1559   CALCIUM 8.1 (L) 04/25/2014 1534   ALKPHOS 62 08/11/2016 1559   AST 20 08/11/2016 1559   ALT 11 08/11/2016 1559   BILITOT 0.5 08/11/2016 1559         ASSESSMENT & PLAN:   Iron deficiency anemia following bariatric surgery # 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 Hemoglobin 12; iron sat- 15%.  Patient is fatigued. Patient feels improved after IV iron. Recommend IV Feraheme today  # labs/[facility]- 1 week prior; Ferrahem/ MD in 6 months.       Earna CoderGovinda R Olufemi Mofield, MD 11/06/2016 11:54 AM

## 2016-11-05 NOTE — Assessment & Plan Note (Addendum)
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 Hemoglobin 12; iron sat- 15%.  Patient is fatigued. Patient feels improved after IV iron. Recommend IV Feraheme today  # labs/[facility]- 1 week prior; Ferrahem/ MD in 6 months.

## 2017-03-21 ENCOUNTER — Encounter: Payer: Self-pay | Admitting: Podiatry

## 2017-03-21 ENCOUNTER — Ambulatory Visit (INDEPENDENT_AMBULATORY_CARE_PROVIDER_SITE_OTHER): Payer: 59 | Admitting: Podiatry

## 2017-03-21 DIAGNOSIS — Q828 Other specified congenital malformations of skin: Secondary | ICD-10-CM | POA: Diagnosis not present

## 2017-03-21 NOTE — Progress Notes (Signed)
This patient presents the office with chief complaint of pain noted from the callus on the outside of her left foot. She says she has joined [employer] in an effort to lose weight.  Walking has led to the callus formation which is painful walking and wearing her shoes. She is presently unable to self treat. She presents the office today for an evaluation and treatment of this painful callus, left foot     GENERAL APPEARANCE: Alert, conversant. Appropriately groomed. No acute distress.  VASCULAR: Pedal pulses are  palpable at  [facility] and PT bilateral.  Capillary refill time is immediate to all digits,  Normal temperature gradient.  Digital hair growth is present bilateral  NEUROLOGIC: sensation is normal to 5.07 monofilament at 5/5 sites bilateral.  Light touch is intact bilateral, Muscle strength normal.  MUSCULOSKELETAL: acceptable muscle strength, tone and stability bilateral.  Intrinsic muscluature intact bilateral.   HAV  B/L.   DERMATOLOGIC: skin color, texture, and turgor are within normal limits.  No preulcerative lesions or ulcers  are seen, no interdigital maceration noted.  No open lesions present.  Digital nails are asymptomatic. No drainage noted. 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

## 2017-05-06 ENCOUNTER — Inpatient Hospital Stay: Payer: 59

## 2017-05-06 ENCOUNTER — Inpatient Hospital Stay: Payer: 59 | Admitting: Hematology and Oncology

## 2017-05-06 NOTE — Progress Notes (Deleted)
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  Complains of fatigue on exertion. Denies any blood in stools or black stools. Denies any history of vaginal bleeding. No nausea no vomiting. No chest pain or shortness of breath.  REVIEW OF SYSTEMS:  A complete 10 point review of system is done which is negative except mentioned above/history of present illness.   PAST MEDICAL HISTORY :  Past Medical History:  Diagnosis Date  . Allergy   . Asthma   . Depression   . Hypothyroidism   . Iron deficiency anemia   . Postprandial hypoglycemia     PAST SURGICAL HISTORY :   Past Surgical History:  Procedure Laterality Date  . CESAREAN SECTION    . GASTRIC BYPASS      FAMILY HISTORY :   Family History  Problem Relation Age of Onset  . Arthritis Mother   . Cervical cancer Mother   . Alcoholism Paternal Grandfather   . Stomach cancer Paternal Grandfather   . Heart disease Maternal Grandfather   . Diabetes Maternal Grandmother     SOCIAL HISTORY:   Social History  Substance Use Topics  . Smoking status: Never Smoker  . Smokeless tobacco: Never Used  . Alcohol use No    ALLERGIES:  is allergic to morphine and related and morphine.  MEDICATIONS:  Current Outpatient Prescriptions  Medication Sig Dispense Refill  . cetirizine (ZYRTEC) 10 MG tablet Take 10 mg by mouth daily as needed.     . clobetasol cream (TEMOVATE) 0.05 %     . escitalopram (LEXAPRO) 10 MG tablet TAKE 1 TABLET (10 MG TOTAL) BY MOUTH DAILY. 90 tablet 1  . flintstones complete (FLINTSTONES) 60 MG chewable tablet Chew 2 tablets by mouth daily.    Marland Kitchen. levothyroxine (SYNTHROID, LEVOTHROID) 100 MCG tablet Take 1 tablet (100 mcg total) by mouth daily. 90 tablet 3   . Liraglutide 18 MG/3ML SOPN Inject 0.1 mLs (0.6 mg total) into the skin daily. 3 mL 11   No current facility-administered medications for this visit.     PHYSICAL EXAMINATION: ECOG PERFORMANCE STATUS: 1 - Symptomatic but completely ambulatory  There were no vitals taken for this visit.  There were no vitals filed for this visit.  GENERAL: Well-nourished well-developed; Alert, no distress and comfortable.   Alone. Obese.  EYES: no pallor or icterus OROPHARYNX: no thrush or ulceration; good dentition  NECK: supple, no masses felt LYMPH:  no palpable lymphadenopathy in the cervical, axillary or inguinal regions LUNGS: clear to auscultation and  No wheeze or crackles HEART/CVS: regular rate & rhythm and no murmurs; No lower extremity edema ABDOMEN:abdomen soft, non-tender and normal bowel sounds Musculoskeletal:no cyanosis of digits and no clubbing  PSYCH: alert & oriented x 3 with fluent speech NEURO: no focal motor/sensory deficits SKIN:  no rashes or significant lesions  LABORATORY DATA:  I have reviewed the data as listed    Component Value Date/Time   NA 140 08/11/2016 1559   NA 137 04/25/2014 1534   K 4.9 08/11/2016 1559   K 4.7 04/25/2014 1534   CL 103 08/11/2016 1559   CL 106 04/25/2014 1534   CO2 24 08/11/2016 1559   CO2 22 04/25/2014 1534   GLUCOSE 115 (H) 08/11/2016 1559  GLUCOSE 90 04/25/2014 1534   BUN 12 08/11/2016 1559   BUN 11 04/25/2014 1534   CREATININE 0.68 08/11/2016 1559   CREATININE 0.67 04/25/2014 1534   CALCIUM 8.8 08/11/2016 1559   CALCIUM 8.1 (L) 04/25/2014 1534   PROT 6.8 08/11/2016 1559   ALBUMIN 3.8 08/11/2016 1559   AST 20 08/11/2016 1559   ALT 11 08/11/2016 1559   ALKPHOS 62 08/11/2016 1559   BILITOT 0.5 08/11/2016 1559   GFRNONAA 111 08/11/2016 1559   GFRNONAA >60 04/25/2014 1534   GFRAA 127 08/11/2016 1559   GFRAA >60 04/25/2014 1534    No results found for: SPEP, UPEP  Lab Results  Component Value Date   WBC 5.0 06/17/2014    NEUTROABS 2.7 06/17/2014   HGB 11.8 06/17/2014   HCT 37.5 06/17/2014   MCV 79 06/17/2014   PLT 264 04/25/2014      Chemistry      Component Value Date/Time   NA 140 08/11/2016 1559   NA 137 04/25/2014 1534   K 4.9 08/11/2016 1559   K 4.7 04/25/2014 1534   CL 103 08/11/2016 1559   CL 106 04/25/2014 1534   CO2 24 08/11/2016 1559   CO2 22 04/25/2014 1534   BUN 12 08/11/2016 1559   BUN 11 04/25/2014 1534   CREATININE 0.68 08/11/2016 1559   CREATININE 0.67 04/25/2014 1534      Component Value Date/Time   CALCIUM 8.8 08/11/2016 1559   CALCIUM 8.1 (L) 04/25/2014 1534   ALKPHOS 62 08/11/2016 1559   AST 20 08/11/2016 1559   ALT 11 08/11/2016 1559   BILITOT 0.5 08/11/2016 1559         ASSESSMENT & PLAN:   No problem-specific Assessment & Plan notes found for this encounter.      Rosey Bath, MD 05/06/2017 6:01 AM

## 2017-05-20 ENCOUNTER — Other Ambulatory Visit: Payer: Self-pay | Admitting: Hematology and Oncology

## 2017-05-20 ENCOUNTER — Inpatient Hospital Stay: Payer: 59 | Attending: Internal Medicine | Admitting: Hematology and Oncology

## 2017-05-20 ENCOUNTER — Encounter: Payer: Self-pay | Admitting: Hematology and Oncology

## 2017-05-20 ENCOUNTER — Inpatient Hospital Stay: Payer: 59

## 2017-05-20 VITALS — BP 105/72 | HR 80 | Resp 20

## 2017-05-20 VITALS — BP 135/82 | HR 82 | Temp 97.0°F | Resp 18 | Wt 351.1 lb

## 2017-05-20 DIAGNOSIS — K909 Intestinal malabsorption, unspecified: Secondary | ICD-10-CM

## 2017-05-20 DIAGNOSIS — E039 Hypothyroidism, unspecified: Secondary | ICD-10-CM | POA: Insufficient documentation

## 2017-05-20 DIAGNOSIS — Z9884 Bariatric surgery status: Secondary | ICD-10-CM | POA: Insufficient documentation

## 2017-05-20 DIAGNOSIS — D509 Iron deficiency anemia, unspecified: Secondary | ICD-10-CM

## 2017-05-20 DIAGNOSIS — F329 Major depressive disorder, single episode, unspecified: Secondary | ICD-10-CM | POA: Diagnosis not present

## 2017-05-20 DIAGNOSIS — F5089 Other specified eating disorder: Secondary | ICD-10-CM | POA: Insufficient documentation

## 2017-05-20 DIAGNOSIS — Z79899 Other long term (current) drug therapy: Secondary | ICD-10-CM | POA: Insufficient documentation

## 2017-05-20 DIAGNOSIS — Z807 Family history of other malignant neoplasms of lymphoid, hematopoietic and related tissues: Secondary | ICD-10-CM | POA: Insufficient documentation

## 2017-05-20 DIAGNOSIS — Z803 Family history of malignant neoplasm of breast: Secondary | ICD-10-CM | POA: Diagnosis not present

## 2017-05-20 DIAGNOSIS — J45909 Unspecified asthma, uncomplicated: Secondary | ICD-10-CM | POA: Insufficient documentation

## 2017-05-20 DIAGNOSIS — D508 Other iron deficiency anemias: Secondary | ICD-10-CM

## 2017-05-20 DIAGNOSIS — K9589 Other complications of other bariatric procedure: Secondary | ICD-10-CM

## 2017-05-20 MED ORDER — SODIUM CHLORIDE 0.9 % IV SOLN
Freq: Once | INTRAVENOUS | Status: AC
  Administered 2017-05-20: 10:00:00 via INTRAVENOUS
  Filled 2017-05-20: qty 1000

## 2017-05-20 MED ORDER — SODIUM CHLORIDE 0.9 % IV SOLN
510.0000 mg | Freq: Once | INTRAVENOUS | Status: AC
  Administered 2017-05-20: 510 mg via INTRAVENOUS
  Filled 2017-05-20: qty 17

## 2017-05-20 NOTE — Progress Notes (Signed)
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 Donneta Barr on 11/05/2016. At that time, she received Feraheme.  Symptomatically, she is "feeling pretty good".  She receives Feraheme every 6 months. She describes being "constantly on the go". She has 3 boys. Her husband died a year and a half ago.   She has ice pica.  She denies any melena, hematochezia, hematuria or vaginal bleeding.  She joined [employer].  Weight is down 7 pounds.  [facility] labs on 05/10/2017 revealed a hematocrit 39.9, hemoglobin 13.4, MCV 87, platelets 288,000, white count 7600 with an ANC of 4700.  Creatinine was 0.7   Iron studies included a ferritin of 13, iron saturation 9% and TIBC of 371.   REVIEW OF SYSTEMS:  A complete 10 point review of system is done which is negative except mentioned above/history of present illness.   PAST MEDICAL HISTORY :  Past Medical History:  Diagnosis Date  . Allergy   . Asthma   . Depression   . Hypothyroidism   . Iron deficiency anemia   . Postprandial hypoglycemia     PAST SURGICAL HISTORY :   Past Surgical History:  Procedure Laterality Date  . CESAREAN SECTION    . GASTRIC BYPASS      FAMILY HISTORY :   Family History  Problem Relation Age of Onset  . Arthritis Mother   . Cervical cancer Mother   . Alcoholism Paternal Grandfather   . Stomach cancer Paternal Grandfather   . Heart disease Maternal Grandfather   . Diabetes Maternal Grandmother     SOCIAL HISTORY:   Social History  Substance Use Topics  . Smoking status: Never Smoker  . Smokeless tobacco: Never Used  . Alcohol use No    ALLERGIES:  is allergic to morphine and related and morphine.  MEDICATIONS:  Current Outpatient Prescriptions  Medication Sig Dispense  Refill  . cetirizine (ZYRTEC) 10 MG tablet Take 10 mg by mouth daily as needed.     . clobetasol cream (TEMOVATE) 0.05 %     . flintstones complete (FLINTSTONES) 60 MG chewable tablet Chew 2 tablets by mouth daily.    Marland Kitchen. levothyroxine (SYNTHROID, LEVOTHROID) 100 MCG tablet Take 1 tablet (100 mcg total) by mouth daily. 90 tablet 3  . escitalopram (LEXAPRO) 10 MG tablet TAKE 1 TABLET (10 MG TOTAL) BY MOUTH DAILY. (Patient not taking: Reported on 05/20/2017) 90 tablet 1  . Liraglutide 18 MG/3ML SOPN Inject 0.1 mLs (0.6 mg total) into the skin daily. (Patient not taking: Reported on 05/20/2017) 3 mL 11   No current facility-administered medications for this visit.     PHYSICAL EXAMINATION: ECOG PERFORMANCE STATUS: 1 - Symptomatic but completely ambulatory  BP 135/82 (BP Location: Left Arm, Patient Position: Sitting)   Pulse 82   Temp 97 F (36.1 C) (Tympanic)   Resp 18   Wt (!) 351 lb 2 oz (159.3 kg)   BMI 55.82 kg/m   Filed Weights   05/20/17 0841  Weight: (!) 351 lb 2 oz (159.3 kg)    GENERAL:  Well developed, well nourished, heavyset woman sitting comfortably in the exam room in no acute distress. MENTAL STATUS:  Alert and oriented to person, place and time. HEAD:  Dark hair.  Normocephalic, atraumatic, face symmetric,  no Cushingoid features. EYES:  Glasses.  Blue eyes.  Pupils equal round and reactive to light and accomodation.  No conjunctivitis or scleral icterus. ENT:  Oropharynx clear without lesion.  Tongue normal. Mucous membranes moist.  RESPIRATORY:  Clear to auscultation without rales, wheezes or rhonchi. CARDIOVASCULAR:  Regular rate and rhythm without murmur, rub or gallop. ABDOMEN:  Fully round.  Soft, non-tender, with active bowel sounds, and no appreciable hepatosplenomegaly.  No masses. SKIN:  No rashes, ulcers or lesions. EXTREMITIES: No edema, no skin discoloration or tenderness.  No palpable cords. LYMPH NODES: No palpable cervical, supraclavicular, axillary or  inguinal adenopathy  NEUROLOGICAL: Unremarkable. PSYCH:  Appropriate.    LABORATORY DATA:  I have reviewed the data as listed    Component Value Date/Time   NA 140 08/11/2016 1559   NA 137 04/25/2014 1534   K 4.9 08/11/2016 1559   K 4.7 04/25/2014 1534   CL 103 08/11/2016 1559   CL 106 04/25/2014 1534   CO2 24 08/11/2016 1559   CO2 22 04/25/2014 1534   GLUCOSE 115 (H) 08/11/2016 1559   GLUCOSE 90 04/25/2014 1534   BUN 12 08/11/2016 1559   BUN 11 04/25/2014 1534   CREATININE 0.68 08/11/2016 1559   CREATININE 0.67 04/25/2014 1534   CALCIUM 8.8 08/11/2016 1559   CALCIUM 8.1 (L) 04/25/2014 1534   PROT 6.8 08/11/2016 1559   ALBUMIN 3.8 08/11/2016 1559   AST 20 08/11/2016 1559   ALT 11 08/11/2016 1559   ALKPHOS 62 08/11/2016 1559   BILITOT 0.5 08/11/2016 1559   GFRNONAA 111 08/11/2016 1559   GFRNONAA >60 04/25/2014 1534   GFRAA 127 08/11/2016 1559   GFRAA >60 04/25/2014 1534    No results found for: SPEP, UPEP  Lab Results  Component Value Date   WBC 5.0 06/17/2014   NEUTROABS 2.7 06/17/2014   HGB 11.8 06/17/2014   HCT 37.5 06/17/2014   MCV 79 06/17/2014   PLT 264 04/25/2014      Chemistry      Component Value Date/Time   NA 140 08/11/2016 1559   NA 137 04/25/2014 1534   K 4.9 08/11/2016 1559   K 4.7 04/25/2014 1534   CL 103 08/11/2016 1559   CL 106 04/25/2014 1534   CO2 24 08/11/2016 1559   CO2 22 04/25/2014 1534   BUN 12 08/11/2016 1559   BUN 11 04/25/2014 1534   CREATININE 0.68 08/11/2016 1559   CREATININE 0.67 04/25/2014 1534      Component Value Date/Time   CALCIUM 8.8 08/11/2016 1559   CALCIUM 8.1 (L) 04/25/2014 1534   ALKPHOS 62 08/11/2016 1559   AST 20 08/11/2016 1559   ALT 11 08/11/2016 1559   BILITOT 0.5 08/11/2016 1559         ASSESSMENT & PLAN:   Iron deficiency anemia following bariatric surgery # 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 Hemoglobin 13.4; iron sat- 9%.  Ferritin 13 (low). Patient feels  improved after IV iron.   Feraheme 510 mg IV today.  RTC in 6 months for MD (Dr Kelly Barr) assessment, review of [facility] labs, and +/- Feraheme.     Kelly Bath, MD 05/20/2017 8:59 AM

## 2017-05-20 NOTE — Progress Notes (Signed)
Patient states she has joined [employer].  She offers no complaints today.

## 2017-06-06 ENCOUNTER — Encounter: Payer: Self-pay | Admitting: Internal Medicine

## 2017-08-31 ENCOUNTER — Telehealth: Payer: Self-pay | Admitting: Family Medicine

## 2017-08-31 DIAGNOSIS — E039 Hypothyroidism, unspecified: Secondary | ICD-10-CM

## 2017-08-31 DIAGNOSIS — K9589 Other complications of other bariatric procedure: Secondary | ICD-10-CM

## 2017-08-31 DIAGNOSIS — D509 Iron deficiency anemia, unspecified: Secondary | ICD-10-CM

## 2017-08-31 NOTE — Telephone Encounter (Signed)
Patient scheduled a physical with Dr.Bedsole on 09/16/17.  Patient works for [employer] and would like the lab order put in the computer for [employer].

## 2017-09-09 ENCOUNTER — Ambulatory Visit
Admission: RE | Admit: 2017-09-09 | Discharge: 2017-09-09 | Disposition: A | Discharge status: Home / Self Care | Payer: PRIVATE HEALTH INSURANCE | Source: Ambulatory Visit | Attending: Family | Admitting: Family

## 2017-09-09 ENCOUNTER — Other Ambulatory Visit: Payer: Self-pay | Admitting: Family

## 2017-09-09 ENCOUNTER — Ambulatory Visit
Admission: RE | Admit: 2017-09-09 | Discharge: 2017-09-09 | Disposition: A | Discharge status: Home / Self Care | Payer: Worker's Compensation | Source: Ambulatory Visit | Attending: Family | Admitting: Family

## 2017-09-09 DIAGNOSIS — M25462 Effusion, left knee: Secondary | ICD-10-CM | POA: Diagnosis not present

## 2017-09-09 DIAGNOSIS — T1490XA Injury, unspecified, initial encounter: Secondary | ICD-10-CM | POA: Insufficient documentation

## 2017-09-09 DIAGNOSIS — M25562 Pain in left knee: Secondary | ICD-10-CM | POA: Insufficient documentation

## 2017-09-09 DIAGNOSIS — X58XXXA Exposure to other specified factors, initial encounter: Secondary | ICD-10-CM | POA: Insufficient documentation

## 2017-09-16 ENCOUNTER — Encounter: Payer: 59 | Admitting: Family Medicine

## 2017-09-23 ENCOUNTER — Ambulatory Visit: Payer: Self-pay | Admitting: Family Medicine

## 2017-09-24 ENCOUNTER — Encounter: Payer: Self-pay | Admitting: Internal Medicine

## 2017-09-27 ENCOUNTER — Encounter: Payer: Self-pay | Admitting: Family Medicine

## 2017-09-27 ENCOUNTER — Ambulatory Visit (INDEPENDENT_AMBULATORY_CARE_PROVIDER_SITE_OTHER): Payer: 59 | Admitting: Family Medicine

## 2017-09-27 DIAGNOSIS — Z13220 Encounter for screening for lipoid disorders: Secondary | ICD-10-CM

## 2017-09-27 DIAGNOSIS — D509 Iron deficiency anemia, unspecified: Secondary | ICD-10-CM | POA: Diagnosis not present

## 2017-09-27 DIAGNOSIS — F325 Major depressive disorder, single episode, in full remission: Secondary | ICD-10-CM

## 2017-09-27 DIAGNOSIS — E039 Hypothyroidism, unspecified: Secondary | ICD-10-CM | POA: Diagnosis not present

## 2017-09-27 DIAGNOSIS — K9589 Other complications of other bariatric procedure: Secondary | ICD-10-CM

## 2017-09-27 DIAGNOSIS — D508 Other iron deficiency anemias: Secondary | ICD-10-CM

## 2017-09-27 MED ORDER — LEVOTHYROXINE SODIUM 100 MCG PO TABS: 100.0000 ug | ORAL_TABLET | Freq: Every day | ORAL | 3 refills | Status: DC

## 2017-09-27 NOTE — Patient Instructions (Addendum)
Have labs [facility] at [facility].  Schedule physical when able tin next few months.

## 2017-09-27 NOTE — Assessment & Plan Note (Signed)
Well controlled off lexapro. Stress management and relaxation techniques.

## 2017-09-27 NOTE — Assessment & Plan Note (Signed)
Counseled in depth onlifstyle changes.  Encouraged exercise, weight loss, healthy eating habits.

## 2017-09-27 NOTE — Assessment & Plan Note (Signed)
Due for thyroid re-eval.

## 2017-09-27 NOTE — Progress Notes (Signed)
   Subjective:    Patient ID: Kelly Barr, female    DOB: October 17, 1976, 41 y.o.   MRN: [id number]  HPI  The patient is here  a form to complete for work as well. Health screening.  She cannot do CPX today.  Due for fasting lab eval for cholesterol, CMET, thyroid eval and cbc.   Morbid obesity: She has lost 30 lbs in the last year. She is working on diet changes.. Weight watchers since 03/2017. Has weight watchers app, decreasing portion. Increasing fruits a veggies. She is trying to increase her physical activity.. Trying to find time for walks after work several times a week.  Wt Readings from Last 3 Encounters:  09/27/17 (!) 336 lb 8 oz (152.6 kg)  05/20/17 (!) 351 lb 2 oz (159.3 kg)  11/05/16 (!) 358 lb 12.8 oz (162.8 kg)   Body mass index is 54.31 kg/m.   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     Review of Systems  Constitutional: Negative for fatigue and fever.  HENT: Negative for congestion and ear pain.   Eyes: Negative for pain.  Respiratory: Negative for cough, chest tightness and shortness of breath.   Cardiovascular: Negative for chest pain, palpitations and leg swelling.  Gastrointestinal: Negative for abdominal pain.  Genitourinary: Negative for dysuria and vaginal bleeding.  Musculoskeletal: Negative for back pain.  Neurological: Negative for syncope, light-headedness and headaches.  Psychiatric/Behavioral: Negative for dysphoric mood.       Objective:   Physical Exam  Constitutional: Vital signs are normal. She appears well-developed and well-nourished. She is cooperative.  Non-toxic appearance. She does not appear ill. No distress.  Morbid obesity  HENT:  Head: Normocephalic.  Right Ear: Hearing, tympanic membrane, external ear and ear canal normal. Tympanic membrane is not erythematous, not retracted and not bulging.  Left Ear: Hearing, tympanic membrane, external ear and ear canal normal. Tympanic membrane is  not erythematous, not retracted and not bulging.  Nose: No mucosal edema or rhinorrhea. Right sinus exhibits no maxillary sinus tenderness and no frontal sinus tenderness. Left sinus exhibits no maxillary sinus tenderness and no frontal sinus tenderness.  Mouth/Throat: Uvula is midline, oropharynx is clear and moist and mucous membranes are normal.  Eyes: Pupils are equal, round, and reactive to light. Conjunctivae, EOM and lids are normal. Lids are everted and swept, no foreign bodies found.  Neck: Trachea normal and normal range of motion. Neck supple. Carotid bruit is not present. No thyroid mass and no thyromegaly present.  Cardiovascular: Normal rate, regular rhythm, S1 normal, S2 normal, normal heart sounds, intact distal pulses and normal pulses.  Exam reveals no gallop and no friction rub.   No murmur heard. Pulmonary/Chest: Effort normal and breath sounds normal. No tachypnea. No respiratory distress. She has no decreased breath sounds. She has no wheezes. She has no rhonchi. She has no rales.  Abdominal: Soft. Normal appearance and bowel sounds are normal. There is no tenderness.  Neurological: She is alert.  Skin: Skin is warm, dry and intact. No rash noted.  Psychiatric: Her speech is normal and behavior is normal. Judgment and thought content normal. Her mood appears not anxious. Cognition and memory are normal. She does not exhibit a depressed mood.          Assessment & Plan:

## 2017-09-30 ENCOUNTER — Telehealth: Payer: Self-pay | Admitting: Internal Medicine

## 2017-09-30 NOTE — Telephone Encounter (Signed)
Rschd from 12/14 to 12/17, per patient request. MD & +/Duncan Dull- FERAHEME. Per patient work schedule.

## 2017-11-04 ENCOUNTER — Telehealth: Payer: Self-pay | Admitting: *Deleted

## 2017-11-04 NOTE — Telephone Encounter (Signed)
Received fax from [facility] stating that Levothyroxine [facility] is on backorder.  Is it okay to change to [place].  Ok to changed per Dr. Ermalene SearingBedsole but patient will need to have her TSH level checked in 4 weeks.  [facility] notified via fax.  [place] notified by telephone.

## 2017-11-10 ENCOUNTER — Telehealth: Payer: Self-pay | Admitting: *Deleted

## 2017-11-10 NOTE — Telephone Encounter (Signed)
Patient is to have labs drawn at [facility] tomorrow and she has lost her order form. Asking that another be filled out for her to pick up this afternoon

## 2017-11-10 NOTE — Telephone Encounter (Signed)
Informed that prescription for lab is ready to pick up

## 2017-11-11 ENCOUNTER — Other Ambulatory Visit: Payer: Self-pay | Admitting: Family Medicine

## 2017-11-12 LAB — CBC WITH DIFFERENTIAL/PLATELET
BASOS ABS: 0 10*3/uL (ref 0.0–0.2)
Basos: 0 %
EOS (ABSOLUTE): 0.1 10*3/uL (ref 0.0–0.4)
Eos: 2 %
Hematocrit: 38.8 % (ref 34.0–46.6)
Hemoglobin: 13.1 g/dL (ref 11.1–15.9)
IMMATURE GRANS (ABS): 0 10*3/uL (ref 0.0–0.1)
Immature Granulocytes: 0 %
LYMPHS: 29 %
Lymphocytes Absolute: 1.3 10*3/uL (ref 0.7–3.1)
MCH: 30.3 pg (ref 26.6–33.0)
MCHC: 33.8 g/dL (ref 31.5–35.7)
MCV: 90 fL (ref 79–97)
MONOS ABS: 0.3 10*3/uL (ref 0.1–0.9)
Monocytes: 6 %
NEUTROS ABS: 2.8 10*3/uL (ref 1.4–7.0)
Neutrophils: 63 %
PLATELETS: 262 10*3/uL (ref 150–379)
RBC: 4.33 x10E6/uL (ref 3.77–5.28)
RDW: 12.7 % (ref 12.3–15.4)
WBC: 4.6 10*3/uL (ref 3.4–10.8)

## 2017-11-12 LAB — COMPREHENSIVE METABOLIC PANEL
ALK PHOS: 61 IU/L (ref 39–117)
ALT: 12 IU/L (ref 0–32)
AST: 19 IU/L (ref 0–40)
Albumin/Globulin Ratio: 1.4 (ref 1.2–2.2)
Albumin: 3.9 g/dL (ref 3.5–5.5)
BILIRUBIN TOTAL: 0.5 mg/dL (ref 0.0–1.2)
BUN/Creatinine Ratio: 15 (ref 9–23)
BUN: 11 mg/dL (ref 6–24)
CHLORIDE: 104 mmol/L (ref 96–106)
CO2: 22 mmol/L (ref 20–29)
Calcium: 8.9 mg/dL (ref 8.7–10.2)
Creatinine, Ser: 0.74 mg/dL (ref 0.57–1.00)
GFR calc Af Amer: 117 mL/min/{1.73_m2} (ref >59)
GFR calc non Af Amer: 102 mL/min/{1.73_m2} (ref >59)
GLUCOSE: 84 mg/dL (ref 65–99)
Globulin, Total: 2.7 g/dL (ref 1.5–4.5)
POTASSIUM: 4.8 mmol/L (ref 3.5–5.2)
Sodium: 140 mmol/L (ref 134–144)
TOTAL PROTEIN: 6.6 g/dL (ref 6.0–8.5)

## 2017-11-12 LAB — T4, FREE: Free T4: 1.13 ng/dL (ref 0.82–1.77)

## 2017-11-12 LAB — LIPID PANEL W/O CHOL/HDL RATIO
CHOLESTEROL TOTAL: 165 mg/dL (ref 100–199)
HDL: 54 mg/dL (ref >39)
LDL Calculated: 98 mg/dL (ref 0–99)
Triglycerides: 66 mg/dL (ref 0–149)
VLDL CHOLESTEROL CAL: 13 mg/dL (ref 5–40)

## 2017-11-12 LAB — SPECIMEN STATUS REPORT

## 2017-11-12 LAB — T3, FREE: T3, Free: 2.6 pg/mL (ref 2.0–4.4)

## 2017-11-12 LAB — TSH: TSH: 2.35 u[IU]/mL (ref 0.450–4.500)

## 2017-11-16 ENCOUNTER — Other Ambulatory Visit: Payer: Self-pay | Admitting: Sports Medicine

## 2017-11-16 ENCOUNTER — Encounter: Payer: Self-pay | Admitting: *Deleted

## 2017-11-16 DIAGNOSIS — M1712 Unilateral primary osteoarthritis, left knee: Secondary | ICD-10-CM

## 2017-11-18 ENCOUNTER — Ambulatory Visit: Payer: 59

## 2017-11-18 ENCOUNTER — Ambulatory Visit: Payer: 59 | Admitting: Internal Medicine

## 2017-11-21 ENCOUNTER — Inpatient Hospital Stay: Payer: 59

## 2017-11-21 ENCOUNTER — Encounter: Payer: Self-pay | Admitting: Internal Medicine

## 2017-11-21 ENCOUNTER — Encounter: Payer: Self-pay | Admitting: Hematology and Oncology

## 2017-11-21 ENCOUNTER — Inpatient Hospital Stay: Payer: 59 | Attending: Internal Medicine | Admitting: Nurse Practitioner

## 2017-11-21 ENCOUNTER — Other Ambulatory Visit: Payer: Self-pay

## 2017-11-21 VITALS — BP 123/86 | HR 73 | Temp 98.2°F | Resp 22 | Ht 66.0 in | Wt 340.0 lb

## 2017-11-21 VITALS — BP 102/72 | HR 78

## 2017-11-21 DIAGNOSIS — E161 Other hypoglycemia: Secondary | ICD-10-CM

## 2017-11-21 DIAGNOSIS — K9589 Other complications of other bariatric procedure: Secondary | ICD-10-CM

## 2017-11-21 DIAGNOSIS — Z8041 Family history of malignant neoplasm of ovary: Secondary | ICD-10-CM | POA: Insufficient documentation

## 2017-11-21 DIAGNOSIS — F329 Major depressive disorder, single episode, unspecified: Secondary | ICD-10-CM | POA: Diagnosis not present

## 2017-11-21 DIAGNOSIS — E039 Hypothyroidism, unspecified: Secondary | ICD-10-CM | POA: Diagnosis not present

## 2017-11-21 DIAGNOSIS — J45909 Unspecified asthma, uncomplicated: Secondary | ICD-10-CM | POA: Diagnosis not present

## 2017-11-21 DIAGNOSIS — R0602 Shortness of breath: Secondary | ICD-10-CM | POA: Diagnosis not present

## 2017-11-21 DIAGNOSIS — R5383 Other fatigue: Secondary | ICD-10-CM | POA: Diagnosis not present

## 2017-11-21 DIAGNOSIS — Z79899 Other long term (current) drug therapy: Secondary | ICD-10-CM | POA: Diagnosis not present

## 2017-11-21 DIAGNOSIS — D509 Iron deficiency anemia, unspecified: Secondary | ICD-10-CM | POA: Insufficient documentation

## 2017-11-21 DIAGNOSIS — K909 Intestinal malabsorption, unspecified: Secondary | ICD-10-CM

## 2017-11-21 DIAGNOSIS — Z9884 Bariatric surgery status: Secondary | ICD-10-CM | POA: Diagnosis not present

## 2017-11-21 MED ORDER — FERUMOXYTOL INJECTION 510 MG/17 ML
510.0000 mg | Freq: Once | INTRAVENOUS | Status: AC
  Administered 2017-11-21: 510 mg via INTRAVENOUS
  Filled 2017-11-21: qty 17

## 2017-11-21 NOTE — Progress Notes (Signed)
[facility] OFFICE PROGRESS NOTE  Patient Care Team: Excell SeltzerBedsole, Amy E, MD as PCP - General   SUMMARY OF ONCOLOGIC HISTORY:  # 2006- IRON DEFICIENCY ANEMIA sec Malabsorption/ gastric bypass, Roux-en-Y; IV Feraheme  INTERVAL HISTORY: Patient admits to mild to moderate fatigue with mild sob w/ exertion. Denies blood in stool or black stool. Denies abnormal vaginal bleeding. Denies nausea or vomiting. No chest pain. Has MRI of knee scheduled for late this week. Denies ice pica. Labs were drawn at [facility]. Last dose of feraheme 05/2017. Tolerates infusions well.    REVIEW OF SYSTEMS:  A complete 10 point review of system is done which is negative except mentioned above/history of present illness.   PAST MEDICAL HISTORY :  Past Medical History:  Diagnosis Date  . Allergy   . Asthma   . Depression   . Hypothyroidism   . Iron deficiency anemia   . Postprandial hypoglycemia     PAST SURGICAL HISTORY :   Past Surgical History:  Procedure Laterality Date  . CESAREAN SECTION    . GASTRIC BYPASS      FAMILY HISTORY :   Family History  Problem Relation Age of Onset  . Arthritis Mother   . Cervical cancer Mother   . Alcoholism Paternal Grandfather   . Stomach cancer Paternal Grandfather   . Heart disease Maternal Grandfather   . Diabetes Maternal Grandmother     SOCIAL HISTORY:   Social History   Tobacco Use  . Smoking status: Never Smoker  . Smokeless tobacco: Never Used  Substance Use Topics  . Alcohol use: No    Alcohol/week: 0.0 oz  . Drug use: No    ALLERGIES:  is allergic to morphine and related and morphine.  MEDICATIONS:  Current Outpatient Medications  Medication Sig Dispense Refill  . flintstones complete (FLINTSTONES) 60 MG chewable tablet Chew 2 tablets by mouth daily.    Marland Kitchen. HYDROcodone-acetaminophen (NORCO/VICODIN) 5-325 MG tablet Take 1 tablet by mouth at bedtime.    Marland Kitchen. ibuprofen (ADVIL,MOTRIN) 200 MG tablet Take 1 tablet by mouth every 6 (six)  hours as needed for pain.    Marland Kitchen. levothyroxine (SYNTHROID, LEVOTHROID) 100 MCG tablet Take 1 tablet (100 mcg total) by mouth daily. 30 tablet 3  . cetirizine (ZYRTEC) 10 MG tablet Take 10 mg by mouth daily as needed.     . clobetasol cream (TEMOVATE) 0.05 %      No current facility-administered medications for this visit.     PHYSICAL EXAMINATION: ECOG PERFORMANCE STATUS: 1 - Symptomatic but completely ambulatory  BP 123/86 (Patient Position: Sitting)   Pulse 73   Temp 98.2 F (36.8 C) (Tympanic)   Resp (!) 22   Ht 5\' 6"  (1.676 m)   Wt (!) 340 lb (154.2 kg)   BMI 54.88 kg/m   Filed Weights   11/21/17 1111  Weight: (!) 340 lb (154.2 kg)    GENERAL: Well-nourished well-developed; Alert, no distress and comfortable. Alone. Obese.  EYES: no pallor or icterus OROPHARYNX: no thrush or ulceration; good dentition  NECK: supple, no masses felt LYMPH:  no palpable lymphadenopathy in the cervical, axillary or inguinal regions LUNGS: clear to auscultation and  No wheeze or crackles HEART/CVS: regular rate & rhythm and no murmurs; No lower extremity edema ABDOMEN:abdomen soft, non-tender and normal bowel sounds Musculoskeletal:no cyanosis of digits and no clubbing; on crutches for knee  PSYCH: alert & oriented x 3 with fluent speech NEURO: no focal motor/sensory deficits SKIN:  no rashes  or significant lesions  LABORATORY DATA:  I have reviewed the data as listed    Component Value Date/Time   NA 140 11/11/2017 0805   NA 137 04/25/2014 1534   K 4.8 11/11/2017 0805   K 4.7 04/25/2014 1534   CL 104 11/11/2017 0805   CL 106 04/25/2014 1534   CO2 22 11/11/2017 0805   CO2 22 04/25/2014 1534   GLUCOSE 84 11/11/2017 0805   GLUCOSE 90 04/25/2014 1534   BUN 11 11/11/2017 0805   BUN 11 04/25/2014 1534   CREATININE 0.74 11/11/2017 0805   CREATININE 0.67 04/25/2014 1534   CALCIUM 8.9 11/11/2017 0805   CALCIUM 8.1 (L) 04/25/2014 1534   PROT 6.6 11/11/2017 0805   ALBUMIN 3.9 11/11/2017  0805   AST 19 11/11/2017 0805   ALT 12 11/11/2017 0805   ALKPHOS 61 11/11/2017 0805   BILITOT 0.5 11/11/2017 0805   GFRNONAA 102 11/11/2017 0805   GFRNONAA >60 04/25/2014 1534   GFRAA 117 11/11/2017 0805   GFRAA >60 04/25/2014 1534    No results found for: SPEP, UPEP  Lab Results  Component Value Date   WBC 4.6 11/11/2017   NEUTROABS 2.8 11/11/2017   HGB 13.1 11/11/2017   HCT 38.8 11/11/2017   MCV 90 11/11/2017   PLT 262 11/11/2017      Chemistry      Component Value Date/Time   NA 140 11/11/2017 0805   NA 137 04/25/2014 1534   K 4.8 11/11/2017 0805   K 4.7 04/25/2014 1534   CL 104 11/11/2017 0805   CL 106 04/25/2014 1534   CO2 22 11/11/2017 0805   CO2 22 04/25/2014 1534   BUN 11 11/11/2017 0805   BUN 11 04/25/2014 1534   CREATININE 0.74 11/11/2017 0805   CREATININE 0.67 04/25/2014 1534      Component Value Date/Time   CALCIUM 8.9 11/11/2017 0805   CALCIUM 8.1 (L) 04/25/2014 1534   ALKPHOS 61 11/11/2017 0805   AST 19 11/11/2017 0805   ALT 12 11/11/2017 0805   BILITOT 0.5 11/11/2017 0805       ASSESSMENT & PLAN:   Iron deficiency anemia following bariatric surgery # 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 Hemoglobin 13; iron sat- 15%.  Patient is fatigued. Patient feels improved after IV iron. Recommend IV Feraheme today  # labs/[facility]- 1 week prior; Feraheme/ MD in 6 months.       Alinda DoomsLauren G Edilson Vital, NP 11/21/2017 11:54 AM

## 2017-11-21 NOTE — Assessment & Plan Note (Addendum)
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 Hemoglobin 13; iron sat- 15%.  Patient is fatigued. Patient feels improved after IV iron. Recommend IV Feraheme today  # labs/[facility]- 1 week prior; Feraheme/ MD in 6 months.

## 2017-11-23 ENCOUNTER — Encounter: Payer: Self-pay | Admitting: Hematology and Oncology

## 2017-11-24 ENCOUNTER — Ambulatory Visit
Admission: RE | Admit: 2017-11-24 | Discharge: 2017-11-24 | Disposition: A | Discharge status: Home / Self Care | Payer: 59 | Source: Ambulatory Visit | Attending: Sports Medicine | Admitting: Sports Medicine

## 2017-11-24 DIAGNOSIS — M1712 Unilateral primary osteoarthritis, left knee: Secondary | ICD-10-CM | POA: Insufficient documentation

## 2017-11-24 DIAGNOSIS — M21862 Other specified acquired deformities of left lower leg: Secondary | ICD-10-CM | POA: Insufficient documentation

## 2017-12-16 ENCOUNTER — Inpatient Hospital Stay: Admission: RE | Admit: 2017-12-16 | Payer: 59 | Source: Ambulatory Visit

## 2017-12-19 ENCOUNTER — Encounter
Admission: RE | Admit: 2017-12-19 | Discharge: 2017-12-19 | Disposition: A | Discharge status: Home / Self Care | Payer: Managed Care, Other (non HMO) | Source: Ambulatory Visit | Attending: Orthopedic Surgery | Admitting: Orthopedic Surgery

## 2017-12-19 ENCOUNTER — Other Ambulatory Visit: Payer: Self-pay

## 2017-12-19 DIAGNOSIS — Z01818 Encounter for other preprocedural examination: Secondary | ICD-10-CM | POA: Diagnosis present

## 2017-12-19 HISTORY — DX: Other specified postprocedural states: Z98.890

## 2017-12-19 HISTORY — DX: Nausea with vomiting, unspecified: R11.2

## 2017-12-19 NOTE — Patient Instructions (Signed)
Your procedure is scheduled on: Friday 12/23/17 Report to DAY SURGERY. 2ND FLOOR MEDICAL MALL ENTRANCE. To find out your arrival time please call [phone] between 1PM - 3PM on Thursday 12/22/17.  Remember: Instructions that are not followed completely may result in serious medical risk, up to and including death, or upon the discretion of your surgeon and anesthesiologist your surgery may need to be rescheduled.    __X__ 1. Do not eat anything after midnight the night before your    procedure.  No gum chewing or hard candies.  You may drink clear   liquids up to 2 hours before you are scheduled to arrive at the   hospital for your procedure. Do not drink clear liquids within 2   hours of scheduled arrival to the hospital as this may lead to your   procedure being delayed or rescheduled.       Clear liquids include:   Water or Apple juice without pulp   Clear carbohydrate beverage such as Clearfast or Gatorade   Black coffee or Clear Tea (no milk, no creamer, do not add anything   to the coffee or tea)    Diabetics should only drink water   __X__ 2. No Alcohol for 24 hours before or after surgery.   ____ 3. Bring all medications with you on the day of surgery if instructed.    __X__ 4. Notify your doctor if there is any change in your medical condition     (cold, fever, infections).             __X___5. No smoking within 24 hours of your surgery.     Do not wear jewelry, make-up, hairpins, clips or nail polish.  Do not wear lotions, powders, or perfumes.   Do not shave 48 hours prior to surgery. Men may shave face and neck.  Do not bring valuables to the hospital.    [facility] is not responsible for any belongings or valuables.               Contacts, dentures or bridgework may not be worn into surgery.  Leave your suitcase in the car. After surgery it may be brought to your room.  For patients admitted to the hospital, discharge time is determined by your                 treatment team.   Patients discharged the day of surgery will not be allowed to drive home.   Please read over the following fact sheets that you were given:   MRSA Information   ____ Take these medicines the morning of surgery with A SIP OF WATER:    1. none  2.   3.   4.  5.  6.  ____ Fleet Enema (as directed)   __X__ Use CHG Soap/SAGE wipes as directed  ____ Use inhalers on the day of surgery  ____ Stop metformin 2 days prior to surgery    ____ Take 1/2 of usual insulin dose the night before surgery and none on the morning of surgery.   ____ Stop Coumadin/Plavix/aspirin on   __X__ Stop Anti-inflammatories such as Advil, Aleve, Ibuprofen, Motrin, Naproxen, Naprosyn, Goodies,powder, or aspirin products.  OK to take Tylenol.   __X__ Stop supplements, Vitamin E, Fish Oil until after surgery.    ____ Bring C-Pap to the hospital.

## 2017-12-23 ENCOUNTER — Ambulatory Visit
Admission: RE | Admit: 2017-12-23 | Discharge: 2017-12-23 | Disposition: A | Discharge status: Home / Self Care | Payer: Managed Care, Other (non HMO) | Source: Ambulatory Visit | Attending: Orthopedic Surgery | Admitting: Orthopedic Surgery

## 2017-12-23 ENCOUNTER — Encounter
Admission: RE | Disposition: A | Discharge status: Home / Self Care | Payer: Self-pay | Source: Ambulatory Visit | Attending: Orthopedic Surgery

## 2017-12-23 ENCOUNTER — Ambulatory Visit: Payer: Managed Care, Other (non HMO) | Admitting: Anesthesiology

## 2017-12-23 DIAGNOSIS — Z791 Long term (current) use of non-steroidal anti-inflammatories (NSAID): Secondary | ICD-10-CM | POA: Diagnosis not present

## 2017-12-23 DIAGNOSIS — Z79890 Hormone replacement therapy (postmenopausal): Secondary | ICD-10-CM | POA: Insufficient documentation

## 2017-12-23 DIAGNOSIS — M2342 Loose body in knee, left knee: Secondary | ICD-10-CM | POA: Diagnosis not present

## 2017-12-23 DIAGNOSIS — E039 Hypothyroidism, unspecified: Secondary | ICD-10-CM | POA: Insufficient documentation

## 2017-12-23 DIAGNOSIS — M1712 Unilateral primary osteoarthritis, left knee: Secondary | ICD-10-CM | POA: Insufficient documentation

## 2017-12-23 DIAGNOSIS — Z79899 Other long term (current) drug therapy: Secondary | ICD-10-CM | POA: Insufficient documentation

## 2017-12-23 DIAGNOSIS — Z9884 Bariatric surgery status: Secondary | ICD-10-CM | POA: Diagnosis not present

## 2017-12-23 DIAGNOSIS — M25562 Pain in left knee: Secondary | ICD-10-CM | POA: Diagnosis present

## 2017-12-23 HISTORY — PX: KNEE ARTHROSCOPY: SHX127

## 2017-12-23 HISTORY — PX: CHONDROPLASTY: SHX5177

## 2017-12-23 LAB — POCT PREGNANCY, URINE: Preg Test, Ur: NEGATIVE

## 2017-12-23 SURGERY — ARTHROSCOPY, KNEE
Anesthesia: General | Laterality: Left

## 2017-12-23 MED ORDER — LIDOCAINE-EPINEPHRINE 1 %-1:100000 IJ SOLN
INTRAMUSCULAR | Status: DC | PRN
  Administered 2017-12-23: 30 mL

## 2017-12-23 MED ORDER — LIDOCAINE-EPINEPHRINE 1 %-1:100000 IJ SOLN
INTRAMUSCULAR | Status: AC
  Filled 2017-12-23: qty 1

## 2017-12-23 MED ORDER — FAMOTIDINE 20 MG PO TABS
20.0000 mg | ORAL_TABLET | Freq: Once | ORAL | Status: AC
  Administered 2017-12-23: 20 mg via ORAL

## 2017-12-23 MED ORDER — IBUPROFEN 800 MG PO TABS: 800.0000 mg | ORAL_TABLET | Freq: Three times a day (TID) | ORAL | 0 refills | Status: AC

## 2017-12-23 MED ORDER — SUCCINYLCHOLINE CHLORIDE 20 MG/ML IJ SOLN
INTRAMUSCULAR | Status: AC
  Filled 2017-12-23: qty 1

## 2017-12-23 MED ORDER — DEXAMETHASONE SODIUM PHOSPHATE 10 MG/ML IJ SOLN
INTRAMUSCULAR | Status: DC | PRN
  Administered 2017-12-23: 10 mg via INTRAVENOUS

## 2017-12-23 MED ORDER — FAMOTIDINE 20 MG PO TABS
ORAL_TABLET | ORAL | Status: AC
  Administered 2017-12-23: 20 mg via ORAL
  Filled 2017-12-23: qty 1

## 2017-12-23 MED ORDER — PROPOFOL 10 MG/ML IV BOLUS
INTRAVENOUS | Status: DC | PRN
  Administered 2017-12-23: 200 mg via INTRAVENOUS

## 2017-12-23 MED ORDER — ASPIRIN EC 325 MG PO TBEC: 325.0000 mg | DELAYED_RELEASE_TABLET | Freq: Every day | ORAL | 0 refills | Status: AC

## 2017-12-23 MED ORDER — LACTATED RINGERS IV SOLN
INTRAVENOUS | Status: DC
  Administered 2017-12-23 (×2): via INTRAVENOUS

## 2017-12-23 MED ORDER — ROCURONIUM BROMIDE 50 MG/5ML IV SOLN
INTRAVENOUS | Status: AC
  Filled 2017-12-23: qty 1

## 2017-12-23 MED ORDER — ONDANSETRON HCL 4 MG/2ML IJ SOLN
INTRAMUSCULAR | Status: DC | PRN
  Administered 2017-12-23: 4 mg via INTRAVENOUS

## 2017-12-23 MED ORDER — PROPOFOL 10 MG/ML IV BOLUS
INTRAVENOUS | Status: AC
  Filled 2017-12-23: qty 40

## 2017-12-23 MED ORDER — ROCURONIUM BROMIDE 100 MG/10ML IV SOLN
INTRAVENOUS | Status: DC | PRN
  Administered 2017-12-23: 50 mg via INTRAVENOUS

## 2017-12-23 MED ORDER — SUGAMMADEX SODIUM 500 MG/5ML IV SOLN
INTRAVENOUS | Status: AC
  Filled 2017-12-23: qty 5

## 2017-12-23 MED ORDER — SUCCINYLCHOLINE CHLORIDE 20 MG/ML IJ SOLN
INTRAMUSCULAR | Status: DC | PRN
  Administered 2017-12-23: 150 mg via INTRAVENOUS

## 2017-12-23 MED ORDER — LIDOCAINE HCL (CARDIAC) 20 MG/ML IV SOLN
INTRAVENOUS | Status: DC | PRN
  Administered 2017-12-23: 100 mg via INTRAVENOUS

## 2017-12-23 MED ORDER — SUGAMMADEX SODIUM 500 MG/5ML IV SOLN
INTRAVENOUS | Status: DC | PRN
  Administered 2017-12-23: 300 mg via INTRAVENOUS

## 2017-12-23 MED ORDER — ONDANSETRON HCL 4 MG/2ML IJ SOLN
INTRAMUSCULAR | Status: AC
  Filled 2017-12-23: qty 2

## 2017-12-23 MED ORDER — SCOPOLAMINE 1 MG/3DAYS TD PT72
MEDICATED_PATCH | TRANSDERMAL | Status: AC
  Administered 2017-12-23: 1.5 mg via TRANSDERMAL
  Filled 2017-12-23: qty 1

## 2017-12-23 MED ORDER — PROMETHAZINE HCL 25 MG/ML IJ SOLN
6.2500 mg | INTRAMUSCULAR | Status: DC | PRN
  Administered 2017-12-23: 6.25 mg via INTRAVENOUS

## 2017-12-23 MED ORDER — OXYCODONE HCL 5 MG PO TABS: 5.0000 mg | ORAL_TABLET | Freq: Once | ORAL | Status: DC | PRN

## 2017-12-23 MED ORDER — MIDAZOLAM HCL 2 MG/2ML IJ SOLN
INTRAMUSCULAR | Status: DC | PRN
  Administered 2017-12-23: 2 mg via INTRAVENOUS

## 2017-12-23 MED ORDER — FENTANYL CITRATE (PF) 100 MCG/2ML IJ SOLN
INTRAMUSCULAR | Status: AC
  Filled 2017-12-23: qty 2

## 2017-12-23 MED ORDER — LIDOCAINE HCL (PF) 2 % IJ SOLN
INTRAMUSCULAR | Status: AC
  Filled 2017-12-23: qty 10

## 2017-12-23 MED ORDER — HYDROCODONE-ACETAMINOPHEN 5-325 MG PO TABS
ORAL_TABLET | ORAL | Status: AC
  Filled 2017-12-23: qty 1

## 2017-12-23 MED ORDER — MIDAZOLAM HCL 2 MG/2ML IJ SOLN
INTRAMUSCULAR | Status: AC
Start: 2017-12-23 — End: 2017-12-23
  Filled 2017-12-23: qty 2

## 2017-12-23 MED ORDER — ONDANSETRON 4 MG PO TBDP: 4.0000 mg | ORAL_TABLET | Freq: Three times a day (TID) | ORAL | 0 refills | Status: DC | PRN

## 2017-12-23 MED ORDER — BUPIVACAINE HCL (PF) 0.5 % IJ SOLN
INTRAMUSCULAR | Status: AC
  Filled 2017-12-23: qty 30

## 2017-12-23 MED ORDER — DEXAMETHASONE SODIUM PHOSPHATE 10 MG/ML IJ SOLN
INTRAMUSCULAR | Status: AC
  Filled 2017-12-23: qty 1

## 2017-12-23 MED ORDER — PHENYLEPHRINE HCL 10 MG/ML IJ SOLN
INTRAMUSCULAR | Status: AC
  Filled 2017-12-23: qty 1

## 2017-12-23 MED ORDER — PROMETHAZINE HCL 25 MG/ML IJ SOLN
INTRAMUSCULAR | Status: AC
  Administered 2017-12-23: 6.25 mg via INTRAVENOUS
  Filled 2017-12-23: qty 1

## 2017-12-23 MED ORDER — HYDROCODONE-ACETAMINOPHEN 5-325 MG PO TABS: 1.0000 | ORAL_TABLET | ORAL | 0 refills | Status: DC | PRN

## 2017-12-23 MED ORDER — DEXTROSE 5 % IV SOLN
3.0000 g | Freq: Once | INTRAVENOUS | Status: AC
  Administered 2017-12-23: 3 g via INTRAVENOUS
  Filled 2017-12-23: qty 3000

## 2017-12-23 MED ORDER — FENTANYL CITRATE (PF) 100 MCG/2ML IJ SOLN
25.0000 ug | INTRAMUSCULAR | Status: DC | PRN
  Administered 2017-12-23: 25 ug via INTRAVENOUS
  Administered 2017-12-23: 50 ug via INTRAVENOUS
  Administered 2017-12-23: 25 ug via INTRAVENOUS
  Administered 2017-12-23: 50 ug via INTRAVENOUS

## 2017-12-23 MED ORDER — HYDROCODONE-ACETAMINOPHEN 5-325 MG PO TABS
1.0000 | ORAL_TABLET | ORAL | Status: DC | PRN
  Administered 2017-12-23: 1 via ORAL

## 2017-12-23 MED ORDER — SCOPOLAMINE 1 MG/3DAYS TD PT72
1.0000 | MEDICATED_PATCH | Freq: Once | TRANSDERMAL | Status: DC
  Administered 2017-12-23: 1.5 mg via TRANSDERMAL

## 2017-12-23 MED ORDER — MEPERIDINE HCL 50 MG/ML IJ SOLN: 6.2500 mg | INTRAMUSCULAR | Status: DC | PRN

## 2017-12-23 MED ORDER — OXYCODONE HCL 5 MG/5ML PO SOLN: 5.0000 mg | Freq: Once | ORAL | Status: DC | PRN

## 2017-12-23 MED ORDER — FENTANYL CITRATE (PF) 100 MCG/2ML IJ SOLN
INTRAMUSCULAR | Status: DC | PRN
  Administered 2017-12-23 (×2): 50 ug via INTRAVENOUS

## 2017-12-23 MED ORDER — FENTANYL CITRATE (PF) 100 MCG/2ML IJ SOLN
INTRAMUSCULAR | Status: AC
  Administered 2017-12-23: 25 ug via INTRAVENOUS
  Filled 2017-12-23: qty 2

## 2017-12-23 SURGICAL SUPPLY — 42 items
ADAPTER IRRIG TUBE 2 SPIKE SOL (ADAPTER) ×6 IMPLANT
BLADE SURG SZ11 CARB STEEL (BLADE) ×3 IMPLANT
BNDG ESMARK 6X12 TAN STRL LF (GAUZE/BANDAGES/DRESSINGS) IMPLANT
BRUSH SCRUB EZ  4% CHG (MISCELLANEOUS) ×2
BRUSH SCRUB EZ 4% CHG (MISCELLANEOUS) ×1 IMPLANT
BUR RADIUS 3.5 (BURR) ×3 IMPLANT
BUR RADIUS 4.0X18.5 (BURR) IMPLANT
CHLORAPREP W/TINT 26ML (MISCELLANEOUS) ×6 IMPLANT
CLOSURE WOUND 1/2 X4 (GAUZE/BANDAGES/DRESSINGS) ×1
COOLER POLAR GLACIER W/PUMP (MISCELLANEOUS) ×3 IMPLANT
CUFF TOURN 24 STER (MISCELLANEOUS) IMPLANT
CUFF TOURN 30 STER DUAL PORT (MISCELLANEOUS) IMPLANT
CUFF TURQT 42 SGL BLD (MISCELLANEOUS) ×3 IMPLANT
DRAPE IMP U-DRAPE 54X76 (DRAPES) ×3 IMPLANT
GAUZE SPONGE 4X4 12PLY STRL (GAUZE/BANDAGES/DRESSINGS) ×3 IMPLANT
GLOVE BIOGEL PI IND STRL 8 (GLOVE) ×1 IMPLANT
GLOVE BIOGEL PI INDICATOR 8 (GLOVE) ×2
GLOVE SURG SYN 7.5  E (GLOVE) ×2
GLOVE SURG SYN 7.5 E (GLOVE) ×1 IMPLANT
GOWN STRL REUS W/ TWL LRG LVL3 (GOWN DISPOSABLE) ×1 IMPLANT
GOWN STRL REUS W/TWL LRG LVL3 (GOWN DISPOSABLE) ×2
GOWN STRL REUS W/TWL LRG LVL4 (GOWN DISPOSABLE) ×3 IMPLANT
IV LACTATED RINGER IRRG 3000ML (IV SOLUTION) ×8
IV LR IRRIG 3000ML ARTHROMATIC (IV SOLUTION) ×4 IMPLANT
KIT RM TURNOVER STRD PROC AR (KITS) ×3 IMPLANT
MANIFOLD NEPTUNE II (INSTRUMENTS) ×3 IMPLANT
MAT BLUE FLOOR 46X72 FLO (MISCELLANEOUS) ×3 IMPLANT
NEEDLE HYPO 22GX1.5 SAFETY (NEEDLE) ×3 IMPLANT
PACK ARTHROSCOPY KNEE (MISCELLANEOUS) ×3 IMPLANT
PAD ABD DERMACEA PRESS 5X9 (GAUZE/BANDAGES/DRESSINGS) ×6 IMPLANT
PAD WRAPON POLOR MULTI XL (MISCELLANEOUS) ×1 IMPLANT
SET TUBE SUCT SHAVER OUTFL 24K (TUBING) ×3 IMPLANT
SET TUBE TIP INTRA-ARTICULAR (MISCELLANEOUS) ×3 IMPLANT
STRIP CLOSURE SKIN 1/2X4 (GAUZE/BANDAGES/DRESSINGS) ×2 IMPLANT
SUT ETHILON 4-0 (SUTURE) ×2
SUT ETHILON 4-0 FS2 18XMFL BLK (SUTURE) ×1
SUTURE ETHLN 4-0 FS2 18XMF BLK (SUTURE) ×1 IMPLANT
TUBING ARTHRO INFLOW-ONLY STRL (TUBING) ×3 IMPLANT
WAND HAND CNTRL MULTIVAC 50 (MISCELLANEOUS) IMPLANT
WAND HAND CNTRL MULTIVAC 90 (MISCELLANEOUS) IMPLANT
WRAP-ON POLOR PAD MULTI XL (MISCELLANEOUS) ×1
WRAPON POLOR PAD MULTI XL (MISCELLANEOUS) ×2

## 2017-12-23 NOTE — OR Nursing (Signed)
Discussed discharge instructions with pt and friend. Both voice understanding. 

## 2017-12-23 NOTE — Anesthesia Procedure Notes (Signed)
Procedure Name: Intubation Date/Time: 12/23/2017 7:41 AM Performed by: Silvana Newness, CRNA Pre-anesthesia Checklist: Patient identified, Emergency Drugs available, Suction available, Patient being monitored and Timeout performed Patient Re-evaluated:Patient Re-evaluated prior to induction Oxygen Delivery Method: Circle system utilized Preoxygenation: Pre-oxygenation with 100% oxygen Induction Type: IV induction Ventilation: Mask ventilation without difficulty Laryngoscope Size: Mac and 3 Grade View: Grade I Tube type: Oral Tube size: 7.0 mm Number of attempts: 1 Airway Equipment and Method: Stylet Placement Confirmation: ETT inserted through vocal cords under direct vision,  positive ETCO2 and breath sounds checked- equal and bilateral Secured at: 20 cm Tube secured with: Tape Dental Injury: Teeth and Oropharynx as per pre-operative assessment

## 2017-12-23 NOTE — Transfer of Care (Signed)
Immediate Anesthesia Transfer of Care Note  Patient: Kelly Barr  Procedure(s) Performed: ARTHROSCOPY KNEE WITH LOOSE BODY REMOVAL (Left ) CHONDROPLASTY (Left )  Patient Location: PACU  Anesthesia Type:General  Level of Consciousness: awake, oriented and patient cooperative  Airway & Oxygen Therapy: Patient Spontanous Breathing and Patient connected to face mask oxygen  Post-op Assessment: Report given to RN and Post -op Vital signs reviewed and stable  Post vital signs: Reviewed and stable  Last Vitals:  Vitals:   12/23/17 0621 12/23/17 0850  BP: 130/82 122/83  Pulse: 64 71  Resp: 18 11  Temp: 36.8 C 36.9 C  SpO2: 99% 100%    Last Pain:  Vitals:   12/23/17 0621  TempSrc: Oral  PainSc: 4          Complications: No apparent anesthesia complications

## 2017-12-23 NOTE — H&P (Signed)
Paper H&P to be scanned into permanent record. H&P reviewed. No significant changes noted.  

## 2017-12-23 NOTE — Anesthesia Postprocedure Evaluation (Signed)
Anesthesia Post Note  Patient: Kelly Barr  Procedure(s) Performed: ARTHROSCOPY KNEE WITH LOOSE BODY REMOVAL (Left ) CHONDROPLASTY (Left )  Patient location during evaluation: PACU Anesthesia Type: General Level of consciousness: awake and alert and oriented Pain management: pain level controlled Vital Signs Assessment: post-procedure vital signs reviewed and stable Respiratory status: spontaneous breathing, nonlabored ventilation and respiratory function stable Cardiovascular status: blood pressure returned to baseline and stable Postop Assessment: no signs of nausea or vomiting Anesthetic complications: no     Last Vitals:  Vitals:   12/23/17 1005 12/23/17 1031  BP:    Pulse: (!) 57   Resp: 15 16  Temp:    SpO2: 99%     Last Pain:  Vitals:   12/23/17 1031  TempSrc: Temporal  PainSc:                  Kippy Gohman

## 2017-12-23 NOTE — Op Note (Signed)
Operative Note   SURGERY DATE: 12/23/2017  PRE-OP DIAGNOSIS:  1. Left knee loose bodies 2. Left knee osteoarthritis  POST-OP DIAGNOSIS: 1. Left knee loose bodies 2. Left knee osteoarthritis  PROCEDURES:  1. Left knee arthroscopic loose body removal 2. Left knee chondroplasty of patellofemoral, lateral, and medial compartments  SURGEON: Rosealee Albee, MD  ANESTHESIA: Gen  ESTIMATED BLOOD LOSS:minimal  TOTAL IV FLUIDS: per anesthesia  INDICATION(S):  ELLIS Barr is a 42 y.o. female with signs and symptoms as well as MRI finding of loose bodies in the knee. After discussion of risks, benefits, and alternatives to surgery, the patient elected to proceed.  OPERATIVE FINDINGS:   Examination under anesthesia:A careful examination under anesthesia was performed. Passive range of motion was: Hyperextension: 0. Extension: 2. Flexion: 120. Lachman: normal. Pivot Shift: normal. Posterior drawer: normal. Varus stability in full extension: normal. Varus stability in 30 degrees of flexion: normal. Valgus stability in full extension: normal. Valgus stability in 30 degrees of flexion: normal.  Intra-operative findings:A thorough arthroscopic examination of the knee was performed. The findings are: 1. Suprapatellar pouch: Normal 2. Undersurface of median ridge: Normal 3. Medial patellar facet: are of grade 3 degenerative changes, diffuse grade 2 changes 4. Lateral patellar facet: area of grade 4 degenerative changes and diffuse grade 2-3 changes 5. Trochlea: area of grade 3 degenerative changes, diffuse grade 1-2 changes centrally and laterally 6. Lateral gutter/popliteus tendon: Normal 7. Hoffa's fat pad: normal 8. Medial gutter/plica: Normal 9. ACL: Normal, multiple loose bodies just medial to ACL and lateral to medial femoral condyle in intercondylar notch 10. PCL: Normal 11. Medial meniscus: normal 12. Medial compartment cartilage: osteochondral defect on the  lateral aspect of the medial femoral condyle with adjacent cartilage flap, a majority of which is facing the intercondylar notch and a non-weight bearing area, otherwise some mild softening (grade 1 changes) to femoral condyle 13. Lateral meniscus: normal 14. Lateral compartment cartilage: Grade 1-2 changes to tibial plateau  OPERATIVE REPORT:   I identified Kelly Barr the pre-operative holding area. I marked theoperativeknee with my initials. I reviewed the risks and benefits of the proposed surgical intervention and the patient (and/or patient's guardian) wished to proceed. The patient was transferred to the operative suite and placed in the supine position with all bony prominences padded. Anesthesia was administered. Appropriate IV antibioticswere administered prior to incision. The extremity was then prepped and draped in standard fashion. A time out was performed confirming the correct extremity, correct patient, and correct procedure.  Arthroscopy portals were marked. Local anesthetic was injected to the planned portal sites. The anterolateral portalwasestablished with an 11blade.   The arthroscope was placed in the anterolateral portal and theninto the suprapatellar pouch. A diagnostic knee scope was completed with the above findings. The loose bodies and areas of cartilage injury and degenerative cartilage were identified.  Next the medial portal was established under needle localization. The loose bodies were removed from the intercondylar notch with a grasper. There were at least 7 distinct pieces that were removed. The largest piece measured 10x75mm. In aggregate, they measured ~74mm x 7mm.  A chondroplasty was performed of the medial compartment in the area of the donor site. A biter and shaver were used to debride the loose flaps of cartilage until stable cartilage edges were achieved.. A shaver was used to debride loose pieces and frayed edges of cartilage in the  lateral compartment and patellofemoral compartments such that there were stable cartilage edges without any loose fragments  of cartilage. The posterolateral and posteromedial compartments were also checked for loose bodies, and there were none in these compartments. Arthroscopic fluid was removed from the joint.  The portals were closed with 3-0 Nylon suture. Local anesthetic was injected into the knee. Sterile dressings included Xeroform, 4x4s, Sof-Rol, and Bias wrap. A Polarcare was placed.  The patient was then awakened and taken to the PACU hemodynamically stable without complication.   POSTOPERATIVE PLAN: The patient will be discharged home today once they meet PACU criteria. Aspirin 325 mg daily was prescribed for 2 weeks for DVT prophylaxis. Physical therapy will start on POD#3-4.Weight-bearing as tolerated. They will follow up in 2 weeks per protocol.

## 2017-12-23 NOTE — Discharge Instructions (Addendum)
Arthroscopic Knee Surgery  Post-Op Instructions  1. Bracing or crutches: Crutches will be provided at the time of discharge from the surgery center if you do not already have them.  2. Ice: You may be provided with a device [facility]([employer]) that allows you to ice the affected area effectively. Otherwise you can ice manually.   3. Driving:  Plan on not driving for at least one week. Please note that you are advised NOT to drive while taking narcotic pain medications as you may be impaired and unsafe to drive.  4. Activity: Ankle pumps several times an hour while awake to prevent blood clots. Weight bearing: as tolerated. Use crutches for as needed (usually ~1 week or less) until pain allows you to ambulate without a limp. Bending and straightening the knee is unlimited. Elevate knee above heart level as much as possible for one week. Avoid standing more than 5 minutes (consecutively) for the first week.  Avoid long distance travel for 2 weeks.  5. Medications:  - You have been provided a prescription for narcotic pain medicine. After surgery, take 1-2 narcotic tablets every 4 hours if needed for severe pain.  - A prescription for anti-nausea medication will be provided in case the narcotic medicine causes nausea - take 1 tablet every 6 hours only if nauseated.  - Take ibuprofen 800 mg every 8 hours WITH food to reduce post-operative knee swelling. DO NOT STOP IBUPROFEN POST-OP UNTIL INSTRUCTED TO DO SO at first post-op office visit (10-14 days after surgery). However, please discontinue if you have any abdominal discomfort after taking this.  - Take enteric coated aspirin 325 mg once daily for 2 weeks to prevent blood clots.   6. Bandages: The physical therapist should change the bandages at the first post-op appointment. If needed, the dressing supplies have been provided to you.  7. Physical Therapy: 1-2 times per week for 6 weeks. Therapy typically starts on post operative Day 3 or 4. You  have been provided an order for physical therapy. The therapist will provide home exercises.  8. Work: May return to full work usually around 2 weeks after 1st post-operative visit. May do light duty/desk job in approximately 1-2 weeks when off of narcotics, pain is well-controlled, and swelling has decreased. Labor intensive jobs may require 4-6 weeks to return.    9. Post-Op Appointments: Your first post-op appointment will be with Dr. Allena KatzPatel in approximately 2 weeks time.   If you find that they have not been scheduled please call the Orthopaedic Appointment front desk at [phone].    AMBULATORY SURGERY  DISCHARGE INSTRUCTIONS   1) The drugs that you were given will stay in your system until tomorrow so for the next 24 hours you should not:  A) Drive an automobile B) Make any legal decisions C) Drink any alcoholic beverage   2) You may resume regular meals tomorrow.  Today it is better to start with liquids and gradually work up to solid foods.  You may eat anything you prefer, but it is better to start with liquids, then soup and crackers, and gradually work up to solid foods.   3) Please notify your doctor immediately if you have any unusual bleeding, trouble breathing, redness and pain at the surgery site, drainage, fever, or pain not relieved by medication.    4) Additional Instructions:        Please contact your physician with any problems or Same Day Surgery at [phone], Monday through Friday 6 am to  pm, or [place] at  Main number at [phone]. ° °

## 2017-12-23 NOTE — Anesthesia Post-op Follow-up Note (Signed)
Anesthesia QCDR form completed.        

## 2017-12-23 NOTE — Anesthesia Preprocedure Evaluation (Signed)
Anesthesia Evaluation  Patient identified by MRN, date of birth, ID band Patient awake    Reviewed: Allergy & Precautions, NPO status , Patient's Chart, lab work & pertinent test results  History of Anesthesia Complications Negative for: history of anesthetic complications (only had vomiting during csection)  Airway Mallampati: II  TM Distance: >3 FB Neck ROM: Full    Dental no notable dental hx.    Pulmonary neg sleep apnea, neg COPD,    breath sounds clear to auscultation- rhonchi (-) wheezing      Cardiovascular Exercise Tolerance: Good (-) hypertension(-) CAD, (-) Past MI, (-) Cardiac Stents and (-) CABG  Rhythm:Regular Rate:Normal - Systolic murmurs and - Diastolic murmurs    Neuro/Psych PSYCHIATRIC DISORDERS Depression negative neurological ROS     GI/Hepatic negative GI ROS, Neg liver ROS,   Endo/Other  neg diabetesHypothyroidism   Renal/GU negative Renal ROS     Musculoskeletal negative musculoskeletal ROS (+)   Abdominal (+) + obese,   Peds  Hematology  (+) anemia ,   Anesthesia Other Findings Past Medical History: No date: Allergy No date: Asthma No date: Depression No date: Hypothyroidism No date: Iron deficiency anemia No date: PONV (postoperative nausea and vomiting)     Comment:  HARD TIME WAKING AFTER ENDOSCOPY No date: Postprandial hypoglycemia   Reproductive/Obstetrics                             Anesthesia Physical Anesthesia Plan  ASA: II  Anesthesia Plan: General   Post-op Pain Management:    Induction: Intravenous  PONV Risk Score and Plan: 2 and Ondansetron, Dexamethasone and Scopolamine patch - Pre-op  Airway Management Planned: Oral ETT  Additional Equipment:   Intra-op Plan:   Post-operative Plan: Extubation in OR  Informed Consent: I have reviewed the patients History and Physical, chart, labs and discussed the procedure including the risks,  benefits and alternatives for the proposed anesthesia with the patient or authorized representative who has indicated his/her understanding and acceptance.   Dental advisory given  Plan Discussed with: CRNA and Anesthesiologist  Anesthesia Plan Comments:         Anesthesia Quick Evaluation

## 2018-01-30 ENCOUNTER — Other Ambulatory Visit: Payer: Self-pay | Admitting: *Deleted

## 2018-01-30 MED ORDER — LEVOTHYROXINE SODIUM 100 MCG PO TABS: 100.0000 ug | ORAL_TABLET | Freq: Every day | ORAL | 2 refills | Status: DC

## 2018-03-25 ENCOUNTER — Other Ambulatory Visit: Payer: Self-pay | Admitting: Family Medicine

## 2018-05-22 ENCOUNTER — Encounter: Payer: Self-pay | Admitting: Oncology

## 2018-05-22 ENCOUNTER — Inpatient Hospital Stay: Payer: Managed Care, Other (non HMO)

## 2018-05-22 ENCOUNTER — Inpatient Hospital Stay: Payer: Managed Care, Other (non HMO) | Attending: Oncology | Admitting: Oncology

## 2018-05-22 ENCOUNTER — Other Ambulatory Visit: Payer: Self-pay

## 2018-05-22 VITALS — BP 109/77 | HR 66

## 2018-05-22 DIAGNOSIS — R5383 Other fatigue: Secondary | ICD-10-CM | POA: Diagnosis not present

## 2018-05-22 DIAGNOSIS — D509 Iron deficiency anemia, unspecified: Secondary | ICD-10-CM

## 2018-05-22 DIAGNOSIS — D508 Other iron deficiency anemias: Secondary | ICD-10-CM

## 2018-05-22 DIAGNOSIS — R531 Weakness: Secondary | ICD-10-CM | POA: Diagnosis not present

## 2018-05-22 DIAGNOSIS — K909 Intestinal malabsorption, unspecified: Secondary | ICD-10-CM | POA: Diagnosis not present

## 2018-05-22 DIAGNOSIS — Z9884 Bariatric surgery status: Secondary | ICD-10-CM

## 2018-05-22 MED ORDER — SODIUM CHLORIDE 0.9 % IV SOLN
510.0000 mg | Freq: Once | INTRAVENOUS | Status: AC
  Administered 2018-05-22: 510 mg via INTRAVENOUS
  Filled 2018-05-22: qty 17

## 2018-05-22 MED ORDER — SODIUM CHLORIDE 0.9 % IV SOLN
Freq: Once | INTRAVENOUS | Status: AC
  Administered 2018-05-22: 16:00:00 via INTRAVENOUS
  Filled 2018-05-22: qty 1000

## 2018-05-22 NOTE — Progress Notes (Signed)
[facility] OFFICE PROGRESS NOTE  Patient Care Team: Excell Seltzer, MD as PCP - General   SUMMARY OF ONCOLOGIC HISTORY:  # 2006- IRON DEFICIENCY ANEMIA sec Malabsorption/ gastric bypass, Roux-en-Y; IV Feraheme  INTERVAL HISTORY: Patient was last seen in clinic on 11/21/2017 by Consuello Masse, NP for follow-up on her iron deficiency anemia.  At that time she admitted to mild fatigue with shortness of breath.  She denied blood in her stool or black stool.  She denied vaginal bleeding.  She received 1 dose IV Feraheme.  Today patient complains of significant fatigue and weakness.  Denies any bleeding.  Denies pica, nausea, vomiting, chest pain.  Had left knee replaced on 12/23/2017 d/t osteoarthritis.  Tolerated well.  Had [facility] drawn [facility] on 05/15/2018.   Review of Systems  Constitutional: Positive for malaise/fatigue. Negative for chills, fever and weight loss.  HENT: Negative for congestion and ear pain.   Eyes: Negative.  Negative for blurred vision and double vision.  Respiratory: Negative.  Negative for cough, sputum production and shortness of breath.   Cardiovascular: Negative.  Negative for chest pain, palpitations and leg swelling.  Gastrointestinal: Negative.  Negative for abdominal pain, constipation, diarrhea, nausea and vomiting.  Genitourinary: Negative for dysuria, frequency and urgency.  Musculoskeletal: Negative for back pain and falls.  Skin: Negative.  Negative for rash.  Neurological: Positive for weakness. Negative for headaches.  Endo/Heme/Allergies: Negative.  Does not bruise/bleed easily.  Psychiatric/Behavioral: Negative.  Negative for depression. The patient is not nervous/anxious and does not have insomnia.      PAST MEDICAL HISTORY :  Past Medical History:  Diagnosis Date  . Allergy   . Asthma   . Depression   . Hypothyroidism   . Iron deficiency anemia   . PONV (postoperative nausea and vomiting)    HARD TIME WAKING AFTER  ENDOSCOPY  . Postprandial hypoglycemia     PAST SURGICAL HISTORY :   Past Surgical History:  Procedure Laterality Date  . CESAREAN SECTION    . CHONDROPLASTY Left 12/23/2017   Procedure: CHONDROPLASTY;  Surgeon: Signa Kell, MD;  Location: [facility] ORS;  Service: Orthopedics;  Laterality: Left;  Marland Kitchen GASTRIC BYPASS    . KNEE ARTHROSCOPY Left 12/23/2017   Procedure: ARTHROSCOPY KNEE WITH LOOSE BODY REMOVAL;  Surgeon: Signa Kell, MD;  Location: [facility] ORS;  Service: Orthopedics;  Laterality: Left;    FAMILY HISTORY :   Family History  Problem Relation Age of Onset  . Arthritis Mother   . Cervical cancer Mother   . Alcoholism Paternal Grandfather   . Stomach cancer Paternal Grandfather   . Heart disease Maternal Grandfather   . Diabetes Maternal Grandmother     SOCIAL HISTORY:   Social History   Tobacco Use  . Smoking status: Never Smoker  . Smokeless tobacco: Never Used  Substance Use Topics  . Alcohol use: No    Alcohol/week: 0.0 oz  . Drug use: No    ALLERGIES:  is allergic to morphine and related.  MEDICATIONS:  Current Outpatient Medications  Medication Sig Dispense Refill  . cetirizine (ZYRTEC) 10 MG tablet Take 10 mg by mouth daily as needed for allergies.     Marland Kitchen HYDROcodone-acetaminophen (NORCO) 5-325 MG tablet Take 1-2 tablets by mouth every 4 (four) hours as needed for moderate pain or severe pain. 20 tablet 0  . levothyroxine (SYNTHROID, LEVOTHROID) 100 MCG tablet Take 1 tablet (100 mcg total) by mouth daily. 90 tablet 2  . Multiple Vitamin (  MULTIVITAMIN WITH MINERALS) TABS tablet Take 1 tablet by mouth daily.    . ondansetron (ZOFRAN ODT) 4 MG disintegrating tablet Take 1 tablet (4 mg total) by mouth every 8 (eight) hours as needed for nausea or vomiting. 20 tablet 0  . sodium chloride (OCEAN) 0.65 % SOLN nasal spray Place 1 spray into both nostrils as needed for congestion.     No current facility-administered medications for this visit.    Facility-Administered  Medications Ordered in Other Visits  Medication Dose Route Frequency Provider Last Rate Last Dose  . ferumoxytol (FERAHEME) 510 mg in sodium chloride 0.9 % 100 mL IVPB  510 mg Intravenous Once Mauro KaufmannBurns, Dimonique Bourdeau E, NP 468 mL/hr at 05/22/18 1600 510 mg at 05/22/18 1600    PHYSICAL EXAMINATION: ECOG PERFORMANCE STATUS: 1 - Symptomatic but completely ambulatory  BP (!) 142/103 (BP Location: Right Wrist, Patient Position: Sitting)   Pulse 82   Temp 98 F (36.7 C) (Tympanic)   Resp 12   Ht 5\' 6"  (1.676 m)   Wt (!) 340 lb 3.2 oz (154.3 kg)   PF (!) 12 L/min   BMI 54.91 kg/m   Filed Weights   05/22/18 1429  Weight: (!) 340 lb 3.2 oz (154.3 kg)    Physical Exam  Constitutional: She is oriented to person, place, and time and well-developed, well-nourished, and in no distress. Vital signs are normal.  HENT:  Head: Normocephalic and atraumatic.  Eyes: Pupils are equal, round, and reactive to light.  Neck: Normal range of motion.  Cardiovascular: Normal rate, regular rhythm and normal heart sounds.  No murmur heard. Pulmonary/Chest: Effort normal and breath sounds normal. She has no wheezes.  Abdominal: Soft. Normal appearance and bowel sounds are normal. She exhibits no distension. There is no tenderness.  Musculoskeletal: Normal range of motion. She exhibits no edema.  Neurological: She is alert and oriented to person, place, and time. Gait normal.  Skin: Skin is warm and dry. No rash noted.  Psychiatric: Mood, memory, affect and judgment normal.  Nursing note and vitals reviewed.   LABORATORY DATA:  I have reviewed the data as listed    Component Value Date/Time   NA 140 11/11/2017 0805   NA 137 04/25/2014 1534   K 4.8 11/11/2017 0805   K 4.7 04/25/2014 1534   CL 104 11/11/2017 0805   CL 106 04/25/2014 1534   CO2 22 11/11/2017 0805   CO2 22 04/25/2014 1534   GLUCOSE 84 11/11/2017 0805   GLUCOSE 90 04/25/2014 1534   BUN 11 11/11/2017 0805   BUN 11 04/25/2014 1534    CREATININE 0.74 11/11/2017 0805   CREATININE 0.67 04/25/2014 1534   CALCIUM 8.9 11/11/2017 0805   CALCIUM 8.1 (L) 04/25/2014 1534   PROT 6.6 11/11/2017 0805   ALBUMIN 3.9 11/11/2017 0805   AST 19 11/11/2017 0805   ALT 12 11/11/2017 0805   ALKPHOS 61 11/11/2017 0805   BILITOT 0.5 11/11/2017 0805   GFRNONAA 102 11/11/2017 0805   GFRNONAA >60 04/25/2014 1534   GFRAA 117 11/11/2017 0805   GFRAA >60 04/25/2014 1534    No results found for: SPEP, UPEP  Lab Results  Component Value Date   WBC 4.6 11/11/2017   NEUTROABS 2.8 11/11/2017   HGB 13.1 11/11/2017   HCT 38.8 11/11/2017   MCV 90 11/11/2017   PLT 262 11/11/2017      Chemistry      Component Value Date/Time   NA 140 11/11/2017 0805   NA 137 04/25/2014  1534   K 4.8 11/11/2017 0805   K 4.7 04/25/2014 1534   CL 104 11/11/2017 0805   CL 106 04/25/2014 1534   CO2 22 11/11/2017 0805   CO2 22 04/25/2014 1534   BUN 11 11/11/2017 0805   BUN 11 04/25/2014 1534   CREATININE 0.74 11/11/2017 0805   CREATININE 0.67 04/25/2014 1534      Component Value Date/Time   CALCIUM 8.9 11/11/2017 0805   CALCIUM 8.1 (L) 04/25/2014 1534   ALKPHOS 61 11/11/2017 0805   AST 19 11/11/2017 0805   ALT 12 11/11/2017 0805   BILITOT 0.5 11/11/2017 0805       ASSESSMENT & PLAN:   Iron deficiency anemia following bariatric surgery # 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   Hemoglobin today 13.2. Iron saturation 13% Ferritin 10  Patient is significantly fatigued.  Proceed with IV Feraheme today.  Orders placed.  RTC in 6 months with labs ([facility] labs)/md assessment and possible IV feraheme.     Greater than 50% was spent in counseling and coordination of care with this patient including but not limited to discussion of the relevant topics above (See A&P) including, but not limited to diagnosis and management of acute and chronic medical conditions.    Mauro Kaufmann, NP 05/22/2018 4:02 PM

## 2018-05-22 NOTE — Progress Notes (Signed)
Patient here for follow up. She reports being tired and has episodes of dizziness and high blood pressure. BP is elevated today 142/103.

## 2018-05-22 NOTE — Assessment & Plan Note (Addendum)
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   Hemoglobin today 13.2. Iron saturation 13% Ferritin 10  Patient is significantly fatigued.  Proceed with IV Feraheme today.  Orders placed.  RTC in 6 months with labs ([facility] labs)/md assessment and possible IV feraheme.

## 2018-11-16 ENCOUNTER — Telehealth: Payer: Self-pay | Admitting: *Deleted

## 2018-11-16 NOTE — Telephone Encounter (Signed)
Patient called and requests a [facility] order form for lab draw prior to her appointment 11/22/18. Please fill it out an day call her to pick it up [phone]

## 2018-11-16 NOTE — Telephone Encounter (Signed)
I am fine with labs thru [facility].  GB

## 2018-11-17 NOTE — Telephone Encounter (Signed)
Form has been filled out - awaiting Dr. Senaida LangeB's signature

## 2018-11-17 NOTE — Telephone Encounter (Signed)
Patient notified via voicemail that [facility] form is ready to be picked up

## 2018-11-22 ENCOUNTER — Telehealth: Payer: Self-pay | Admitting: Internal Medicine

## 2018-11-22 ENCOUNTER — Inpatient Hospital Stay: Payer: Managed Care, Other (non HMO) | Attending: Internal Medicine | Admitting: Internal Medicine

## 2018-11-22 ENCOUNTER — Encounter: Payer: Self-pay | Admitting: Internal Medicine

## 2018-11-22 VITALS — BP 116/78 | HR 70 | Temp 97.6°F | Wt 342.8 lb

## 2018-11-22 DIAGNOSIS — R21 Rash and other nonspecific skin eruption: Secondary | ICD-10-CM | POA: Insufficient documentation

## 2018-11-22 DIAGNOSIS — K909 Intestinal malabsorption, unspecified: Secondary | ICD-10-CM | POA: Diagnosis not present

## 2018-11-22 DIAGNOSIS — Z9884 Bariatric surgery status: Secondary | ICD-10-CM | POA: Diagnosis not present

## 2018-11-22 DIAGNOSIS — R5383 Other fatigue: Secondary | ICD-10-CM

## 2018-11-22 DIAGNOSIS — D500 Iron deficiency anemia secondary to blood loss (chronic): Secondary | ICD-10-CM

## 2018-11-22 DIAGNOSIS — K9589 Other complications of other bariatric procedure: Secondary | ICD-10-CM

## 2018-11-22 DIAGNOSIS — D509 Iron deficiency anemia, unspecified: Secondary | ICD-10-CM

## 2018-11-22 MED ORDER — CLOBETASOL PROPIONATE 0.05 % EX CREA: 1.0000 "application " | TOPICAL_CREAM | Freq: Two times a day (BID) | CUTANEOUS | 0 refills | Status: DC

## 2018-11-22 NOTE — Assessment & Plan Note (Addendum)
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 Iron saturation 19% ;Ferritin 10.  #As patient is symptomatic proceed with IV Feraheme.  # Skin rash chronic- ? Prior response to clobetasol; will refill; further refills der to PCP [facility]]  # DISPOSITION:  # Kelly QuillFerrahem today if possible # follow up in 6 months [[facility]/ferritin/iron studies]- possible ferrahem-Dr.B

## 2018-11-22 NOTE — Telephone Encounter (Signed)
[name]/collete- follow up in 6 months [[facility]/ferritin/iron studies]- possible ferrahem-Dr.B

## 2018-11-22 NOTE — Progress Notes (Signed)
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  Denies any blood in stools black or stools.  She complains of mild to moderate fatigue exertion.  In general she feels improved after IV iron treatments.  Complains of chronic rash on the lateral aspect of the left foot.   Review of Systems  Constitutional: Positive for malaise/fatigue. Negative for chills, diaphoresis, fever and weight loss.  HENT: Negative for nosebleeds and sore throat.   Eyes: Negative for double vision.  Respiratory: Negative for cough, hemoptysis, sputum production, shortness of breath and wheezing.   Cardiovascular: Negative for chest pain, palpitations, orthopnea and leg swelling.  Gastrointestinal: Negative for abdominal pain, blood in stool, constipation, diarrhea, heartburn, melena, nausea and vomiting.  Genitourinary: Negative for dysuria, frequency and urgency.  Musculoskeletal: Negative for back pain and joint pain.  Skin: Positive for itching and rash.  Neurological: Negative for dizziness, tingling, focal weakness, weakness and headaches.  Endo/Heme/Allergies: Does not bruise/bleed easily.  Psychiatric/Behavioral: Negative for depression. The patient is not nervous/anxious and does not have insomnia.      PAST MEDICAL HISTORY :  Past Medical History:  Diagnosis Date  . Allergy   . Asthma   . Depression   . Hypothyroidism   . Iron deficiency anemia   . PONV (postoperative nausea and vomiting)    HARD TIME WAKING AFTER ENDOSCOPY  . Postprandial hypoglycemia     PAST SURGICAL HISTORY :   Past Surgical History:   Procedure Laterality Date  . CESAREAN SECTION    . CHONDROPLASTY Left 12/23/2017   Procedure: CHONDROPLASTY;  Surgeon: Signa KellPatel, Sunny, MD;  Location: [facility] ORS;  Service: Orthopedics;  Laterality: Left;  Marland Kitchen. GASTRIC BYPASS    . KNEE ARTHROSCOPY Left 12/23/2017   Procedure: ARTHROSCOPY KNEE WITH LOOSE BODY REMOVAL;  Surgeon: Signa KellPatel, Sunny, MD;  Location: [facility] ORS;  Service: Orthopedics;  Laterality: Left;    FAMILY HISTORY :   Family History  Problem Relation Age of Onset  . Arthritis Mother   . Cervical cancer Mother   . Alcoholism Paternal Grandfather   . Stomach cancer Paternal Grandfather   . Heart disease Maternal Grandfather   . Diabetes Maternal Grandmother     SOCIAL HISTORY:   Social History   Tobacco Use  . Smoking status: Never Smoker  . Smokeless tobacco: Never Used  Substance Use Topics  . Alcohol use: No    Alcohol/week: 0.0 standard drinks  . Drug use: No    ALLERGIES:  is allergic to morphine and related.  MEDICATIONS:  Current Outpatient Medications  Medication Sig Dispense Refill  . cetirizine (ZYRTEC) 10 MG tablet Take 10 mg by mouth daily as needed for allergies.     . Multiple Vitamin (MULTIVITAMIN WITH MINERALS) TABS tablet Take 1 tablet by mouth daily.    . sodium chloride (OCEAN) 0.65 % SOLN nasal spray Place 1 spray into both nostrils as needed for congestion.    . clobetasol cream (TEMOVATE) 0.05 % Apply 1 application topically 2 (two) times daily. 45 g 0  . levothyroxine (SYNTHROID, LEVOTHROID) 100 MCG tablet Take 1 tablet (100 mcg total) by mouth daily. (Patient not taking: Reported on 11/22/2018)  90 tablet 2   No current facility-administered medications for this visit.     PHYSICAL EXAMINATION: ECOG PERFORMANCE STATUS: 1 - Symptomatic but completely ambulatory  BP 116/78 (BP Location: Left Arm, Patient Position: Sitting, Cuff Size: Normal)   Pulse 70   Temp 97.6 F (36.4 C) (Oral)   Wt (!) 342 lb 12.8 oz (155.5 kg)   BMI 55.33 kg/m    Filed Weights   11/22/18 1450  Weight: (!) 342 lb 12.8 oz (155.5 kg)    Physical Exam  Constitutional: She is oriented to person, place, and time and well-developed, well-nourished, and in no distress.  HENT:  Head: Normocephalic and atraumatic.  Mouth/Throat: Oropharynx is clear and moist. No oropharyngeal exudate.  Eyes: Pupils are equal, round, and reactive to light.  Neck: Normal range of motion. Neck supple.  Cardiovascular: Normal rate and regular rhythm.  Pulmonary/Chest: No respiratory distress. She has no wheezes.  Abdominal: Soft. Bowel sounds are normal. She exhibits no distension and no mass. There is no abdominal tenderness. There is no rebound and no guarding.  Musculoskeletal: Normal range of motion.        General: No tenderness or edema.  Neurological: She is alert and oriented to person, place, and time.  Skin: Skin is warm.  Scaly rash noted around the lateral malleolus chronic  Psychiatric: Affect normal.     LABORATORY DATA:  I have reviewed the data as listed    Component Value Date/Time   NA 140 11/11/2017 0805   NA 137 04/25/2014 1534   K 4.8 11/11/2017 0805   K 4.7 04/25/2014 1534   CL 104 11/11/2017 0805   CL 106 04/25/2014 1534   CO2 22 11/11/2017 0805   CO2 22 04/25/2014 1534   GLUCOSE 84 11/11/2017 0805   GLUCOSE 90 04/25/2014 1534   BUN 11 11/11/2017 0805   BUN 11 04/25/2014 1534   CREATININE 0.74 11/11/2017 0805   CREATININE 0.67 04/25/2014 1534   CALCIUM 8.9 11/11/2017 0805   CALCIUM 8.1 (L) 04/25/2014 1534   PROT 6.6 11/11/2017 0805   ALBUMIN 3.9 11/11/2017 0805   AST 19 11/11/2017 0805   ALT 12 11/11/2017 0805   ALKPHOS 61 11/11/2017 0805   BILITOT 0.5 11/11/2017 0805   GFRNONAA 102 11/11/2017 0805   GFRNONAA >60 04/25/2014 1534   GFRAA 117 11/11/2017 0805   GFRAA >60 04/25/2014 1534    No results found for: SPEP, UPEP  Lab Results  Component Value Date   WBC 4.6 11/11/2017   NEUTROABS 2.8 11/11/2017   HGB 13.1  11/11/2017   HCT 38.8 11/11/2017   MCV 90 11/11/2017   PLT 262 11/11/2017      Chemistry      Component Value Date/Time   NA 140 11/11/2017 0805   NA 137 04/25/2014 1534   K 4.8 11/11/2017 0805   K 4.7 04/25/2014 1534   CL 104 11/11/2017 0805   CL 106 04/25/2014 1534   CO2 22 11/11/2017 0805   CO2 22 04/25/2014 1534   BUN 11 11/11/2017 0805   BUN 11 04/25/2014 1534   CREATININE 0.74 11/11/2017 0805   CREATININE 0.67 04/25/2014 1534      Component Value Date/Time   CALCIUM 8.9 11/11/2017 0805   CALCIUM 8.1 (L) 04/25/2014 1534   ALKPHOS 61 11/11/2017 0805   AST 19 11/11/2017 0805   ALT 12 11/11/2017 0805   BILITOT 0.5 11/11/2017 0805         ASSESSMENT & PLAN:  Iron deficiency anemia following bariatric surgery # 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 Iron saturation 19% ;Ferritin 10.  #As patient is symptomatic proceed with IV Feraheme.  # Skin rash chronic- ? Prior response to clobetasol; will refill; further refills der to PCP [facility] dermatology]  # DISPOSITION:  # Etta Quill today if possible # follow up in 6 months [[facility]/ferritin/iron studies]- possible ferrahem-Dr.B      Earna Coder, MD 11/22/2018 4:41 PM

## 2018-12-04 ENCOUNTER — Inpatient Hospital Stay: Payer: Managed Care, Other (non HMO)

## 2018-12-04 VITALS — BP 117/71 | HR 77 | Temp 96.2°F | Resp 20

## 2018-12-04 DIAGNOSIS — K9589 Other complications of other bariatric procedure: Secondary | ICD-10-CM

## 2018-12-04 DIAGNOSIS — D500 Iron deficiency anemia secondary to blood loss (chronic): Secondary | ICD-10-CM | POA: Diagnosis not present

## 2018-12-04 DIAGNOSIS — D509 Iron deficiency anemia, unspecified: Secondary | ICD-10-CM

## 2018-12-04 MED ORDER — SODIUM CHLORIDE 0.9 % IV SOLN
510.0000 mg | Freq: Once | INTRAVENOUS | Status: AC
  Administered 2018-12-04: 510 mg via INTRAVENOUS
  Filled 2018-12-04: qty 17

## 2018-12-04 MED ORDER — SODIUM CHLORIDE 0.9 % IV SOLN
Freq: Once | INTRAVENOUS | Status: AC
  Administered 2018-12-04: 15:00:00 via INTRAVENOUS
  Filled 2018-12-04: qty 250

## 2018-12-24 ENCOUNTER — Encounter: Payer: Self-pay | Admitting: Internal Medicine

## 2019-05-23 ENCOUNTER — Inpatient Hospital Stay: Payer: Managed Care, Other (non HMO)

## 2019-05-23 ENCOUNTER — Inpatient Hospital Stay: Payer: Managed Care, Other (non HMO) | Admitting: Internal Medicine

## 2019-05-23 ENCOUNTER — Other Ambulatory Visit: Payer: Self-pay | Admitting: *Deleted

## 2019-05-23 DIAGNOSIS — D509 Iron deficiency anemia, unspecified: Secondary | ICD-10-CM

## 2019-05-23 DIAGNOSIS — D508 Other iron deficiency anemias: Secondary | ICD-10-CM

## 2019-06-22 ENCOUNTER — Ambulatory Visit: Payer: Managed Care, Other (non HMO)

## 2019-06-22 ENCOUNTER — Inpatient Hospital Stay: Payer: Managed Care, Other (non HMO) | Attending: Internal Medicine

## 2019-06-22 ENCOUNTER — Ambulatory Visit: Payer: Managed Care, Other (non HMO) | Admitting: Internal Medicine

## 2019-06-22 DIAGNOSIS — D509 Iron deficiency anemia, unspecified: Secondary | ICD-10-CM | POA: Insufficient documentation

## 2019-06-28 ENCOUNTER — Encounter: Payer: Self-pay | Admitting: Internal Medicine

## 2019-06-29 ENCOUNTER — Other Ambulatory Visit: Payer: Self-pay

## 2019-07-02 ENCOUNTER — Inpatient Hospital Stay: Payer: Managed Care, Other (non HMO)

## 2019-07-02 ENCOUNTER — Other Ambulatory Visit: Payer: Self-pay

## 2019-07-02 ENCOUNTER — Inpatient Hospital Stay (HOSPITAL_BASED_OUTPATIENT_CLINIC_OR_DEPARTMENT_OTHER): Payer: Managed Care, Other (non HMO) | Admitting: Internal Medicine

## 2019-07-02 VITALS — BP 118/78 | HR 59 | Temp 97.2°F | Resp 18

## 2019-07-02 DIAGNOSIS — D509 Iron deficiency anemia, unspecified: Secondary | ICD-10-CM | POA: Diagnosis not present

## 2019-07-02 DIAGNOSIS — D508 Other iron deficiency anemias: Secondary | ICD-10-CM

## 2019-07-02 DIAGNOSIS — E538 Deficiency of other specified B group vitamins: Secondary | ICD-10-CM | POA: Diagnosis not present

## 2019-07-02 DIAGNOSIS — R5383 Other fatigue: Secondary | ICD-10-CM

## 2019-07-02 DIAGNOSIS — K909 Intestinal malabsorption, unspecified: Secondary | ICD-10-CM | POA: Diagnosis not present

## 2019-07-02 DIAGNOSIS — Z9884 Bariatric surgery status: Secondary | ICD-10-CM

## 2019-07-02 DIAGNOSIS — K9589 Other complications of other bariatric procedure: Secondary | ICD-10-CM

## 2019-07-02 MED ORDER — SODIUM CHLORIDE 0.9 % IV SOLN
Freq: Once | INTRAVENOUS | Status: AC
  Administered 2019-07-02: 14:00:00 via INTRAVENOUS
  Filled 2019-07-02: qty 250

## 2019-07-02 MED ORDER — FERUMOXYTOL INJECTION 510 MG/17 ML
510.0000 mg | Freq: Once | INTRAVENOUS | Status: AC
  Administered 2019-07-02: 510 mg via INTRAVENOUS
  Filled 2019-07-02: qty 17

## 2019-07-02 NOTE — Progress Notes (Signed)
Patient does not offer any problems today.  [facility] results from 06/27/19 are scanned in her chart.

## 2019-07-02 NOTE — Progress Notes (Signed)
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  Patient denies any blood in stools or black or stools.  Denies any nausea vomiting.  Patient complains of fatigue.    Patient feels improved after IV iron infusions.     Review of Systems  Constitutional: Positive for malaise/fatigue. Negative for chills, diaphoresis, fever and weight loss.  HENT: Negative for nosebleeds and sore throat.   Eyes: Negative for double vision.  Respiratory: Negative for cough, hemoptysis, sputum production, shortness of breath and wheezing.   Cardiovascular: Negative for chest pain, palpitations, orthopnea and leg swelling.  Gastrointestinal: Negative for abdominal pain, blood in stool, constipation, diarrhea, heartburn, melena, nausea and vomiting.  Genitourinary: Negative for dysuria, frequency and urgency.  Musculoskeletal: Negative for back pain and joint pain.  Skin: Positive for itching and rash.  Neurological: Negative for dizziness, tingling, focal weakness, weakness and headaches.  Endo/Heme/Allergies: Does not bruise/bleed easily.  Psychiatric/Behavioral: Negative for depression. The patient is not nervous/anxious and does not have insomnia.      PAST MEDICAL HISTORY :  Past Medical History:  Diagnosis Date  . Allergy   . Asthma   . Depression   . Hypothyroidism   . Iron deficiency anemia   . PONV (postoperative nausea and vomiting)    HARD TIME WAKING AFTER ENDOSCOPY  . Postprandial hypoglycemia     PAST SURGICAL HISTORY :   Past Surgical History:  Procedure Laterality Date  . CESAREAN SECTION    .  CHONDROPLASTY Left 12/23/2017   Procedure: CHONDROPLASTY;  Surgeon: Signa KellPatel, Sunny, MD;  Location: [facility] ORS;  Service: Orthopedics;  Laterality: Left;  Marland Kitchen. GASTRIC BYPASS    . KNEE ARTHROSCOPY Left 12/23/2017   Procedure: ARTHROSCOPY KNEE WITH LOOSE BODY REMOVAL;  Surgeon: Signa KellPatel, Sunny, MD;  Location: [facility] ORS;  Service: Orthopedics;  Laterality: Left;    FAMILY HISTORY :   Family History  Problem Relation Age of Onset  . Arthritis Mother   . Cervical cancer Mother   . Alcoholism Paternal Grandfather   . Stomach cancer Paternal Grandfather   . Heart disease Maternal Grandfather   . Diabetes Maternal Grandmother     SOCIAL HISTORY:   Social History   Tobacco Use  . Smoking status: Never Smoker  . Smokeless tobacco: Never Used  Substance Use Topics  . Alcohol use: No    Alcohol/week: 0.0 standard drinks  . Drug use: No    ALLERGIES:  is allergic to morphine and related.  MEDICATIONS:  Current Outpatient Medications  Medication Sig Dispense Refill  . cetirizine (ZYRTEC) 10 MG tablet Take 10 mg by mouth daily as needed for allergies.     . clobetasol cream (TEMOVATE) 0.05 % Apply 1 application topically 2 (two) times daily. 45 g 0  . levothyroxine (SYNTHROID, LEVOTHROID) 100 MCG tablet Take 1 tablet (100 mcg total) by mouth daily. (Patient not taking: Reported on 11/22/2018) 90 tablet 2  . Multiple Vitamin (MULTIVITAMIN WITH MINERALS) TABS tablet Take 1 tablet by mouth daily.    . sodium chloride (OCEAN) 0.65 % SOLN nasal spray Place 1 spray into both nostrils as needed for congestion.     No current facility-administered  medications for this visit.     PHYSICAL EXAMINATION: ECOG PERFORMANCE STATUS: 1 - Symptomatic but completely ambulatory  BP 126/84   Pulse 66   Temp 99.8 F (37.7 C)   Resp 18   Wt (!) 330 lb 6.4 oz (149.9 kg)   BMI 53.33 kg/m   Filed Weights   07/02/19 1345  Weight: (!) 330 lb 6.4 oz (149.9 kg)    Physical Exam  Constitutional: She is oriented  to person, place, and time and well-developed, well-nourished, and in no distress.  HENT:  Head: Normocephalic and atraumatic.  Mouth/Throat: Oropharynx is clear and moist. No oropharyngeal exudate.  Eyes: Pupils are equal, round, and reactive to light.  Neck: Normal range of motion. Neck supple.  Cardiovascular: Normal rate and regular rhythm.  Pulmonary/Chest: No respiratory distress. She has no wheezes.  Abdominal: Soft. Bowel sounds are normal. She exhibits no distension and no mass. There is no abdominal tenderness. There is no rebound and no guarding.  Musculoskeletal: Normal range of motion.        General: No tenderness or edema.  Neurological: She is alert and oriented to person, place, and time.  Skin: Skin is warm.  Psychiatric: Affect normal.     LABORATORY DATA:  I have reviewed the data as listed    Component Value Date/Time   NA 140 11/11/2017 0805   NA 137 04/25/2014 1534   K 4.8 11/11/2017 0805   K 4.7 04/25/2014 1534   CL 104 11/11/2017 0805   CL 106 04/25/2014 1534   CO2 22 11/11/2017 0805   CO2 22 04/25/2014 1534   GLUCOSE 84 11/11/2017 0805   GLUCOSE 90 04/25/2014 1534   BUN 11 11/11/2017 0805   BUN 11 04/25/2014 1534   CREATININE 0.74 11/11/2017 0805   CREATININE 0.67 04/25/2014 1534   CALCIUM 8.9 11/11/2017 0805   CALCIUM 8.1 (L) 04/25/2014 1534   PROT 6.6 11/11/2017 0805   ALBUMIN 3.9 11/11/2017 0805   AST 19 11/11/2017 0805   ALT 12 11/11/2017 0805   ALKPHOS 61 11/11/2017 0805   BILITOT 0.5 11/11/2017 0805   GFRNONAA 102 11/11/2017 0805   GFRNONAA >60 04/25/2014 1534   GFRAA 117 11/11/2017 0805   GFRAA >60 04/25/2014 1534    No results found for: SPEP, UPEP  Lab Results  Component Value Date   WBC 4.6 11/11/2017   NEUTROABS 2.8 11/11/2017   HGB 13.1 11/11/2017   HCT 38.8 11/11/2017   MCV 90 11/11/2017   PLT 262 11/11/2017      Chemistry      Component Value Date/Time   NA 140 11/11/2017 0805   NA 137 04/25/2014 1534   K 4.8  11/11/2017 0805   K 4.7 04/25/2014 1534   CL 104 11/11/2017 0805   CL 106 04/25/2014 1534   CO2 22 11/11/2017 0805   CO2 22 04/25/2014 1534   BUN 11 11/11/2017 0805   BUN 11 04/25/2014 1534   CREATININE 0.74 11/11/2017 0805   CREATININE 0.67 04/25/2014 1534      Component Value Date/Time   CALCIUM 8.9 11/11/2017 0805   CALCIUM 8.1 (L) 04/25/2014 1534   ALKPHOS 61 11/11/2017 0805   AST 19 11/11/2017 0805   ALT 12 11/11/2017 0805   BILITOT 0.5 11/11/2017 0805         ASSESSMENT & PLAN:   Iron deficiency anemia following bariatric surgery # 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 Iron saturation 10% ;Ferritin  11.  #As patient is symptomatic proceed with IV Feraheme.  # B12 def- recommend Sublingual B12.   # DISPOSITION:  # Ferrahem today # follow up in 6 months- MD [[facility]/ferritin/iron studies/b12]- possible ferrahem-Dr.B      Earna CoderGovinda R Brahmanday, MD 07/02/2019 2:06 PM

## 2019-07-02 NOTE — Assessment & Plan Note (Signed)
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 Iron saturation 10% ;Ferritin 11.  #As patient is symptomatic proceed with IV Feraheme.  # B12 def- recommend Sublingual B12.   # DISPOSITION:  # Ferrahem today # follow up in 6 months- MD [[facility]/ferritin/iron studies/b12]- possible ferrahem-Dr.B

## 2019-07-02 NOTE — Progress Notes (Signed)
Pt tolerated infusion well. Pt declines 30 minute post observation. Pt and VS stable at discharge.  

## 2019-07-25 LAB — HEMOGLOBIN A1C: Hemoglobin A1C: 5

## 2019-07-25 LAB — BASIC METABOLIC PANEL
Creatinine: 0.1 — AB (ref 0.5–1.1)
Creatinine: 0.7 (ref <=1.1)
Glucose: 129

## 2019-07-25 LAB — LIPID PANEL
Cholesterol: 189 (ref 0–200)
HDL: 64 (ref 35–70)
LDL Cholesterol: 110
Triglycerides: 77 (ref 40–160)

## 2019-07-25 LAB — NOVEL CORONAVIRUS, NAA: SARS-CoV-2, NAA: NEGATIVE

## 2019-09-18 ENCOUNTER — Other Ambulatory Visit: Payer: Self-pay

## 2019-09-18 ENCOUNTER — Telehealth: Payer: Self-pay | Admitting: Family Medicine

## 2019-09-18 ENCOUNTER — Ambulatory Visit: Payer: Managed Care, Other (non HMO) | Admitting: Family Medicine

## 2019-09-18 VITALS — BP 122/80 | HR 82 | Temp 97.9°F | Ht 67.0 in | Wt 317.5 lb

## 2019-09-18 DIAGNOSIS — L409 Psoriasis, unspecified: Secondary | ICD-10-CM

## 2019-09-18 DIAGNOSIS — Z30430 Encounter for insertion of intrauterine contraceptive device: Secondary | ICD-10-CM

## 2019-09-18 DIAGNOSIS — Z23 Encounter for immunization: Secondary | ICD-10-CM

## 2019-09-18 DIAGNOSIS — Z13220 Encounter for screening for lipoid disorders: Secondary | ICD-10-CM

## 2019-09-18 MED ORDER — CLOBETASOL PROPIONATE 0.05 % EX CREA: 1.0000 "application " | TOPICAL_CREAM | Freq: Two times a day (BID) | CUTANEOUS | 0 refills | Status: DC

## 2019-09-18 NOTE — Assessment & Plan Note (Signed)
Treat with clobestalsol. Offered derm referral if not improving.

## 2019-09-18 NOTE — Patient Instructions (Signed)
Treat rash with clobetasol.  Work on increasing exercise and heart healthy diet.  We will call to set up GYN referral for IUD as well as pap smear.

## 2019-09-18 NOTE — Telephone Encounter (Signed)
Patient scheduled her cpx in January.  Patient goes to [facility] to have her lab work done.  Patient's requesting a lab order be mailed to her to take to [facility] in January.

## 2019-09-18 NOTE — Telephone Encounter (Signed)
Rx for labs in Donna's box

## 2019-09-18 NOTE — Assessment & Plan Note (Addendum)
Encouraged exercise, weight loss, healthy eating habits.  Completed work form and set out plan for  Continued weight loss.  Follow up weight in 3 months.

## 2019-09-18 NOTE — Progress Notes (Signed)
Chief Complaint  Patient presents with  . Weight Managment    Fill out appeal form for [facility]  . Itchy Skin on Left Ankle    History of Present Illness: HPI  43 year old female pt presents with 2 issues  1. Morbid obesity: In last year she has lost 25 lbs. She has been more active.  She is no longer eating at night, less stress eating.  She is making healthy choices. Watching portion size.  She is not currently exercising.Marland Kitchen able to exercise with partner. She is going to walk  In neighborhood.. plan 3 days a week.    Wt Readings from Last 3 Encounters:  09/18/19 (!) 317 lb 8 oz (144 kg)  07/02/19 (!) 330 lb 6.4 oz (149.9 kg)  11/22/18 (!) 342 lb 12.8 oz (155.5 kg)     2. Rash on left ankle, itchy, presents  Off and on for last 20 years.  Clobetasol helps a lot.  Thickened pinkish rash dry flake    COVID 19 screen No recent travel or known exposure to [place] The patient denies respiratory symptoms of COVID 19 at this time.  The importance of social distancing was discussed today.   Review of Systems  Constitutional: Negative for chills and fever.  HENT: Negative for congestion and ear pain.   Eyes: Negative for pain and redness.  Respiratory: Negative for cough and shortness of breath.   Cardiovascular: Negative for chest pain, palpitations and leg swelling.  Gastrointestinal: Negative for abdominal pain, blood in stool, constipation, diarrhea, nausea and vomiting.  Genitourinary: Negative for dysuria.  Musculoskeletal: Negative for falls and myalgias.  Skin: Negative for rash.  Neurological: Negative for dizziness.  Psychiatric/Behavioral: Negative for depression. The patient is not nervous/anxious.       Past Medical History:  Diagnosis Date  . Allergy   . Asthma   . Depression   . Hypothyroidism   . Iron deficiency anemia   . PONV (postoperative nausea and vomiting)    HARD TIME WAKING AFTER ENDOSCOPY  . Postprandial hypoglycemia     reports that she  has never smoked. She has never used smokeless tobacco. She reports that she does not drink alcohol or use drugs.   Current Outpatient Medications:  .  levothyroxine (SYNTHROID, LEVOTHROID) 100 MCG tablet, Take 1 tablet (100 mcg total) by mouth daily. (Patient not taking: Reported on 11/22/2018), Disp: 90 tablet, Rfl: 2   Observations/Objective: Blood pressure 122/80, pulse 82, temperature 97.9 F (36.6 C), temperature source Temporal, height 5\' 7"  (1.702 m), weight (!) 317 lb 8 oz (144 kg), last menstrual period 09/01/2019, SpO2 98 %.  Physical Exam Constitutional:      General: She is not in acute distress.    Appearance: Normal appearance. She is well-developed. She is obese. She is not ill-appearing or toxic-appearing.  HENT:     Head: Normocephalic.     Right Ear: Hearing, tympanic membrane, ear canal and external ear normal.     Left Ear: Hearing, tympanic membrane, ear canal and external ear normal.     Nose: Nose normal.  Eyes:     General: Lids are normal. Lids are everted, no foreign bodies appreciated.     Conjunctiva/sclera: Conjunctivae normal.     Pupils: Pupils are equal, round, and reactive to light.  Neck:     Musculoskeletal: Normal range of motion and neck supple.     Thyroid: No thyroid mass or thyromegaly.     Vascular: No carotid bruit.  Trachea: Trachea normal.  Cardiovascular:     Rate and Rhythm: Normal rate and regular rhythm.     Heart sounds: Normal heart sounds, S1 normal and S2 normal. No murmur. No gallop.   Pulmonary:     Effort: Pulmonary effort is normal. No respiratory distress.     Breath sounds: Normal breath sounds. No wheezing, rhonchi or rales.  Abdominal:     General: Bowel sounds are normal. There is no distension or abdominal bruit.     Palpations: Abdomen is soft. There is no fluid wave or mass.     Tenderness: There is no abdominal tenderness. There is no guarding or rebound.     Hernia: No hernia is present.  Lymphadenopathy:      Cervical: No cervical adenopathy.  Skin:    General: Skin is warm and dry.     Findings: No rash.     Comments: Pinkish raised plaque on bilateral ankles and lower leg, flaky skin  Neurological:     Mental Status: She is alert.     Cranial Nerves: No cranial nerve deficit.     Sensory: No sensory deficit.  Psychiatric:        Mood and Affect: Mood is not anxious or depressed.        Speech: Speech normal.        Behavior: Behavior normal. Behavior is cooperative.        Judgment: Judgment normal.      Assessment and Plan      Kerby Nora, MD

## 2019-09-19 ENCOUNTER — Telehealth: Payer: Self-pay | Admitting: Obstetrics & Gynecology

## 2019-09-19 ENCOUNTER — Telehealth: Payer: Self-pay | Admitting: Obstetrics and Gynecology

## 2019-09-19 NOTE — Telephone Encounter (Signed)
Ordered in [facility] system

## 2019-09-19 NOTE — Telephone Encounter (Signed)
Patient scheduled for Mirena insertion with AMS on 10/30 at [facility].

## 2019-09-19 NOTE — Telephone Encounter (Signed)
Patient scheduled 10/30 with AMS.

## 2019-09-19 NOTE — Telephone Encounter (Signed)
[facility] referring for Encounter for insertion of mirena IUD. Attempt to reach patient voicemail is full unable to leave message

## 2019-09-19 NOTE — Telephone Encounter (Signed)
Noted. Will order to arrive by apt date/time. 

## 2019-10-03 NOTE — Telephone Encounter (Signed)
Mirena reserved for this patient. 

## 2019-10-04 ENCOUNTER — Telehealth: Payer: Self-pay | Admitting: Obstetrics and Gynecology

## 2019-10-04 NOTE — Telephone Encounter (Signed)
-----   Message from Malachy Mood, MD sent at 10/03/2019  5:50 PM EDT ----- Regarding: IUD on Friday Please make the patient aware that if she is not in the first 10 days of her cycle we can't place the IUD on that visit.  If she has problems with irregular periods or absent periods we can jump start a cycle with provera and discuss that at the visit

## 2019-10-04 NOTE — Telephone Encounter (Signed)
Spoke with patient about menstrual cycle. Patient reports last llMP start date 09/26/19

## 2019-10-05 ENCOUNTER — Encounter: Payer: Self-pay | Admitting: Obstetrics and Gynecology

## 2019-10-05 ENCOUNTER — Ambulatory Visit (INDEPENDENT_AMBULATORY_CARE_PROVIDER_SITE_OTHER): Payer: Managed Care, Other (non HMO) | Admitting: Obstetrics and Gynecology

## 2019-10-05 ENCOUNTER — Other Ambulatory Visit: Payer: Self-pay

## 2019-10-05 VITALS — BP 132/82 | HR 84 | Ht 67.0 in | Wt 318.0 lb

## 2019-10-05 DIAGNOSIS — N924 Excessive bleeding in the premenopausal period: Secondary | ICD-10-CM

## 2019-10-05 DIAGNOSIS — Z124 Encounter for screening for malignant neoplasm of cervix: Secondary | ICD-10-CM

## 2019-10-05 DIAGNOSIS — Z30430 Encounter for insertion of intrauterine contraceptive device: Secondary | ICD-10-CM

## 2019-10-05 MED ORDER — MISOPROSTOL 200 MCG PO TABS: 400.0000 ug | ORAL_TABLET | Freq: Once | ORAL | 0 refills | Status: DC

## 2019-10-05 NOTE — Progress Notes (Signed)
Obstetrics & Gynecology Office Visit   Chief Complaint:  Chief Complaint  Patient presents with  . Contraception    IUD insertion Referred by [facility]    History of Present Illness: Patient is a 43 y.o. No obstetric history on file. presenting for contraception consult.  She is currently on none and desiring to start IUD.  She has a past medical history significant for age >51.  She specifically denies a history of migraine with aura, chronic hypertension, history of DVT/PE and smoking.  Reported Patient's last menstrual period was 09/26/2019 (exact date)..    The patient is also interested in IUD for cycle control benefits.  Her menses have become heavier, increasing dysmenorrhea.    Review of Systems: Review of Systems  Constitutional: Negative.   Gastrointestinal: Negative.   Genitourinary: Negative.      Past Medical History:  Past Medical History:  Diagnosis Date  . Allergy   . Asthma   . Depression   . Hypothyroidism   . Iron deficiency anemia   . PONV (postoperative nausea and vomiting)    HARD TIME WAKING AFTER ENDOSCOPY  . Postprandial hypoglycemia     Past Surgical History:  Past Surgical History:  Procedure Laterality Date  . CESAREAN SECTION    . CHONDROPLASTY Left 12/23/2017   Procedure: CHONDROPLASTY;  Surgeon: Leim Fabry, MD;  Location: [facility] ORS;  Service: Orthopedics;  Laterality: Left;  Marland Kitchen GASTRIC BYPASS    . KNEE ARTHROSCOPY Left 12/23/2017   Procedure: ARTHROSCOPY KNEE WITH LOOSE BODY REMOVAL;  Surgeon: Leim Fabry, MD;  Location: [facility] ORS;  Service: Orthopedics;  Laterality: Left;    Gynecologic History: Patient's last menstrual period was 09/26/2019 (exact date).  Obstetric History: No obstetric history on file.  Family History:  Family History  Problem Relation Age of Onset  . Arthritis Mother   . Cervical cancer Mother   . Alcoholism Paternal Grandfather   . Stomach cancer Paternal Grandfather   . Heart disease Maternal Grandfather    . Diabetes Maternal Grandmother     Social History:  Social History   Socioeconomic History  . Marital status: Widowed    Spouse name: Not on file  . Number of children: Not on file  . Years of education: Not on file  . Highest education level: Not on file  Occupational History  . Not on file  Social Needs  . Financial resource strain: Not on file  . Food insecurity    Worry: Not on file    Inability: Not on file  . Transportation needs    Medical: Not on file    Non-medical: Not on file  Tobacco Use  . Smoking status: Never Smoker  . Smokeless tobacco: Never Used  Substance and Sexual Activity  . Alcohol use: No    Alcohol/week: 0.0 standard drinks  . Drug use: No  . Sexual activity: Yes  Lifestyle  . Physical activity    Days per week: Not on file    Minutes per session: Not on file  . Stress: Not on file  Relationships  . Social Herbalist on phone: Not on file    Gets together: Not on file    Attends religious service: Not on file    Active member of club or organization: Not on file    Attends meetings of clubs or organizations: Not on file    Relationship status: Not on file  . Intimate partner violence    Fear of  current or ex partner: Not on file    Emotionally abused: Not on file    Physically abused: Not on file    Forced sexual activity: Not on file  Other Topics Concern  . Not on file  Social History Narrative  . Not on file    Allergies:  Allergies  Allergen Reactions  . Morphine And Related Hives and Shortness Of Breath    rash    Medications: Prior to Admission medications   Medication Sig Start Date End Date Taking? Authorizing Provider  clobetasol cream (TEMOVATE) 0.05 % Apply 1 application topically 2 (two) times daily. 09/18/19  Yes Bedsole, Amy E, MD  levonorgestrel (MIRENA) 20 MCG/24HR IUD 1 each by Intrauterine route once.   Yes [provider]  levothyroxine (SYNTHROID, LEVOTHROID) 100 MCG tablet Take 1  tablet (100 mcg total) by mouth daily. 01/30/18  Yes Bedsole, Amy E, MD  misoprostol (CYTOTEC) 200 MCG tablet Place 2 tablets (400 mcg total) vaginally once for 1 dose. At bedtime evening prior to procedure 10/05/19 10/05/19  Vena Austria, MD    Physical Exam Vitals:  Vitals:   10/05/19 1537  BP: 132/82  Pulse: 84   Patient's last menstrual period was 09/26/2019 (exact date).  General: NAD, obese, appears stated age HEENT: normocephalic, anicteric Pulmonary: No increased work of breathing Genitourinary:  External: Normal external female genitalia.  Normal urethral meatus, normal Bartholin's and Skene's glands.    Vagina: Normal vaginal mucosa, no evidence of prolapse.    Cervix: Grossly normal in appearance, no bleeding,  A standard uterine sound had yielded a value of 9cm. Attempts at sounding the uterus with a Pipelle were unsuccessful.Attempt at cervical dilation also failed to provide a clear tract.  Rather than placing IUD in false tract the procedure was aborted.    Uterus: Non-enlarged, mobile, normal contour.  No CMT  Adnexa: ovaries non-enlarged, no adnexal masses  Rectal: deferred  Lymphatic: no evidence of inguinal lymphadenopathy Extremities: no edema, erythema, or tenderness Neurologic: Grossly intact Psychiatric: mood appropriate, affect full  Female chaperone present for pelvic  portions of the physical exam  Assessment: 43 y.o. contraception consult  Plan: Problem List Items Addressed This Visit    None    Visit Diagnoses    Screening for malignant neoplasm of cervix    -  Primary   Relevant Orders   PapIG, HPV, rfx 16/18   Encounter for IUD insertion       Relevant Orders   US PELVIS TRANSVAGINAL NON-OB (TV ONLY)   Excessive bleeding in premenopausal period       Relevant Orders   US PELVIS TRANSVAGINAL NON-OB (TV ONLY)     1) Pap - pap smear obtained today  2) Contraception - unable to sound uterus, will obtain TVUS to evaluate for fibroid or  other pathology that may be obstructing the lower uterine segment.  Will plan on cytotec vaginal prior to procedure and potential use of ultrasound guidance. - Given >40 with [place] obtaining endometrial biopsy at time of IUD placement to rule out hyperplasia.    3) A total of 20 minutes were spent in face-to-face contact with the patient during this encounter with over half of that time devoted to counseling and coordination of care.  4) Return call with next menses for IUD palcement under US guidance.    Vena Austria, MD, [facility], [facility]

## 2019-10-16 LAB — PAPIG, HPV, RFX 16/18
HPV Genotype, 16: NEGATIVE
HPV Genotype, 18: NEGATIVE
HPV, high-risk: POSITIVE — AB

## 2019-11-14 ENCOUNTER — Telehealth: Payer: Self-pay | Admitting: Obstetrics and Gynecology

## 2019-11-14 NOTE — Telephone Encounter (Signed)
Attempt to reach patient to see if she needs to schedule ultrasound the order was placed in October but never schedule . Called and left voicemail for patient to call back to be schedule

## 2019-12-21 ENCOUNTER — Ambulatory Visit (INDEPENDENT_AMBULATORY_CARE_PROVIDER_SITE_OTHER): Payer: Managed Care, Other (non HMO) | Admitting: Family Medicine

## 2019-12-21 ENCOUNTER — Other Ambulatory Visit: Payer: Self-pay

## 2019-12-21 ENCOUNTER — Encounter: Payer: Self-pay | Admitting: Family Medicine

## 2019-12-21 VITALS — BP 100/80 | HR 63 | Temp 98.0°F | Ht 66.5 in | Wt 310.2 lb

## 2019-12-21 DIAGNOSIS — Z114 Encounter for screening for human immunodeficiency virus [HIV]: Secondary | ICD-10-CM

## 2019-12-21 DIAGNOSIS — Z0000 Encounter for general adult medical examination without abnormal findings: Secondary | ICD-10-CM

## 2019-12-21 DIAGNOSIS — E039 Hypothyroidism, unspecified: Secondary | ICD-10-CM

## 2019-12-21 DIAGNOSIS — Z13220 Encounter for screening for lipoid disorders: Secondary | ICD-10-CM

## 2019-12-21 DIAGNOSIS — F325 Major depressive disorder, single episode, in full remission: Secondary | ICD-10-CM

## 2019-12-21 NOTE — Progress Notes (Signed)
Chief Complaint  Patient presents with  . Annual Exam    History of Present Illness: HPI  The patient is here for annual wellness exam and preventative care.     Anemia following bariatric surgery followed by Dr. Donneta Romberg. Has iron infusion every 6 months.   Occ soreness in left breast. Due for mammogram.  MDD, in remission  Hypothyroid  Stable control on levothyroxine 100 mcg but due for re-eval. Lab Results  Component Value Date   TSH 2.350 11/11/2017    Labs reviewed in detail.  Wt Readings from Last 3 Encounters:  12/21/19 (!) 310 lb 4 oz (140.7 kg)  10/05/19 (!) 318 lb (144.2 kg)  09/18/19 (!) 317 lb 8 oz (144 kg)   Body mass index is 49.33 kg/m.  Diet: moderate  Exercise: occ   This visit occurred during the SARS-CoV-2 public health emergency.  Safety protocols were in place, including screening questions prior to the visit, additional usage of staff PPE, and extensive cleaning of exam room while observing appropriate contact time as indicated for disinfecting solutions.   COVID 19 screen:  No recent travel or known exposure to COVID19 The patient denies respiratory symptoms of COVID 19 at this time. The importance of social distancing was discussed today.     Review of Systems  Constitutional: Negative for chills and fever.  HENT: Negative for congestion and ear pain.   Eyes: Negative for pain and redness.  Respiratory: Negative for cough and shortness of breath.   Cardiovascular: Negative for chest pain, palpitations and leg swelling.  Gastrointestinal: Negative for abdominal pain, blood in stool, constipation, diarrhea, nausea and vomiting.  Genitourinary: Negative for dysuria.  Musculoskeletal: Negative for falls and myalgias.  Skin: Negative for rash.  Neurological: Negative for dizziness.  Psychiatric/Behavioral: Negative for depression. The patient is not nervous/anxious.       Past Medical History:  Diagnosis Date  . Allergy   . Asthma    . Depression   . Hypothyroidism   . Iron deficiency anemia   . PONV (postoperative nausea and vomiting)    HARD TIME WAKING AFTER ENDOSCOPY  . Postprandial hypoglycemia     reports that she has never smoked. She has never used smokeless tobacco. She reports that she does not drink alcohol or use drugs.   Current Outpatient Medications:  .  clobetasol cream (TEMOVATE) 0.05 %, Apply 1 application topically 2 (two) times daily., Disp: 45 g, Rfl: 0 .  levothyroxine (SYNTHROID, LEVOTHROID) 100 MCG tablet, Take 1 tablet (100 mcg total) by mouth daily. (Patient not taking: Reported on 12/21/2019), Disp: 90 tablet, Rfl: 2   Observations/Objective: Blood pressure 100/80, pulse 63, temperature 98 F (36.7 C), temperature source Temporal, height 5' 6.5" (1.689 m), weight (!) 310 lb 4 oz (140.7 kg), last menstrual period 12/15/2019, SpO2 98 %.  Physical Exam Constitutional:      General: She is not in acute distress.    Appearance: Normal appearance. She is well-developed. She is obese. She is not ill-appearing or toxic-appearing.  HENT:     Head: Normocephalic.     Right Ear: Hearing, tympanic membrane, ear canal and external ear normal.     Left Ear: Hearing, tympanic membrane, ear canal and external ear normal.     Nose: Nose normal.  Eyes:     General: Lids are normal. Lids are everted, no foreign bodies appreciated.     Conjunctiva/sclera: Conjunctivae normal.     Pupils: Pupils are equal, round, and reactive to light.  Neck:     Thyroid: No thyroid mass or thyromegaly.     Vascular: No carotid bruit.     Trachea: Trachea normal.  Cardiovascular:     Rate and Rhythm: Normal rate and regular rhythm.     Heart sounds: Normal heart sounds, S1 normal and S2 normal. No murmur. No gallop.   Pulmonary:     Effort: Pulmonary effort is normal. No respiratory distress.     Breath sounds: Normal breath sounds. No wheezing, rhonchi or rales.  Abdominal:     General: Bowel sounds are normal.  There is no distension or abdominal bruit.     Palpations: Abdomen is soft. There is no fluid wave or mass.     Tenderness: There is no abdominal tenderness. There is no guarding or rebound.     Hernia: No hernia is present.  Musculoskeletal:     Cervical back: Normal range of motion and neck supple.  Lymphadenopathy:     Cervical: No cervical adenopathy.  Skin:    General: Skin is warm and dry.     Findings: No rash.  Neurological:     Mental Status: She is alert.     Cranial Nerves: No cranial nerve deficit.     Sensory: No sensory deficit.  Psychiatric:        Mood and Affect: Mood is not anxious or depressed.        Speech: Speech normal.        Behavior: Behavior normal. Behavior is cooperative.        Judgment: Judgment normal.      Assessment and Plan   The patient's preventative maintenance and recommended screening tests for an annual wellness exam were reviewed in full today. Brought up to date unless services declined.  Counselled on the importance of diet, exercise, and its role in overall health and mortality. The patient's FH and SH was reviewed, including their home life, tobacco status, and drug and alcohol status.   HIV: will do  mammogram: due  Vaccines: flu 09/2019 uptoda te td PAP:  Per GYN  Major depression in remission [facility]) Doing well on no medication.  Hypothyroidism Previously stable control on levothyroxine 100 mcg but due for re-eval.  Morbid obesity ([place]) Encouraged exercise, weight loss, healthy eating habits.    Eliezer Lofts, MD

## 2019-12-21 NOTE — Patient Instructions (Addendum)
Get back on track with healthy eating and regualr exercsie.  Please stop at the lab to have labs drawn.  Can to set up mammogram oin your own.

## 2019-12-22 LAB — COMPREHENSIVE METABOLIC PANEL
ALT: 9 IU/L (ref 0–32)
AST: 15 IU/L (ref 0–40)
Albumin/Globulin Ratio: 1.4 (ref 1.2–2.2)
Albumin: 3.9 g/dL (ref 3.8–4.8)
Alkaline Phosphatase: 69 IU/L (ref 39–117)
BUN/Creatinine Ratio: 18 (ref 9–23)
BUN: 10 mg/dL (ref 6–24)
Bilirubin Total: 0.4 mg/dL (ref 0.0–1.2)
CO2: 23 mmol/L (ref 20–29)
Calcium: 8.6 mg/dL — ABNORMAL LOW (ref 8.7–10.2)
Chloride: 102 mmol/L (ref 96–106)
Creatinine, Ser: 0.57 mg/dL (ref 0.57–1.00)
GFR calc Af Amer: 132 mL/min/{1.73_m2} (ref >59)
GFR calc non Af Amer: 115 mL/min/{1.73_m2} (ref >59)
Globulin, Total: 2.8 g/dL (ref 1.5–4.5)
Glucose: 75 mg/dL (ref 65–99)
Potassium: 4.3 mmol/L (ref 3.5–5.2)
Sodium: 138 mmol/L (ref 134–144)
Total Protein: 6.7 g/dL (ref 6.0–8.5)

## 2019-12-22 LAB — LIPID PANEL
Chol/HDL Ratio: 2.8 ratio (ref 0.0–4.4)
Cholesterol, Total: 173 mg/dL (ref 100–199)
HDL: 61 mg/dL (ref >39)
LDL Chol Calc (NIH): 100 mg/dL — ABNORMAL HIGH (ref 0–99)
Triglycerides: 61 mg/dL (ref 0–149)
VLDL Cholesterol Cal: 12 mg/dL (ref 5–40)

## 2019-12-22 LAB — HEMOGLOBIN A1C
Est. average glucose Bld gHb Est-mCnc: 94 mg/dL
Hgb A1c MFr Bld: 4.9 % (ref 4.8–5.6)

## 2019-12-22 LAB — TSH: TSH: 5.05 u[IU]/mL — ABNORMAL HIGH (ref 0.450–4.500)

## 2019-12-22 LAB — T3, FREE: T3, Free: 2.2 pg/mL (ref 2.0–4.4)

## 2019-12-22 LAB — T4, FREE: Free T4: 0.78 ng/dL — ABNORMAL LOW (ref 0.82–1.77)

## 2019-12-22 LAB — HIV ANTIBODY (ROUTINE TESTING W REFLEX): HIV Screen 4th Generation wRfx: NONREACTIVE

## 2019-12-24 ENCOUNTER — Telehealth: Payer: Self-pay | Admitting: *Deleted

## 2019-12-24 NOTE — Telephone Encounter (Signed)
Patient notified that [facility] requisition is ready to be picked up.

## 2019-12-24 NOTE — Telephone Encounter (Signed)
Patient called reporting that she has lost her lab slip to get her iron levels drawn and is asking if she can pick up another order form tomorrow. Please advise

## 2019-12-25 ENCOUNTER — Other Ambulatory Visit: Payer: Self-pay | Admitting: Family Medicine

## 2019-12-25 DIAGNOSIS — Z1231 Encounter for screening mammogram for malignant neoplasm of breast: Secondary | ICD-10-CM

## 2019-12-25 NOTE — Telephone Encounter (Signed)
See My Chart Message.

## 2019-12-28 ENCOUNTER — Encounter: Payer: Self-pay | Admitting: Internal Medicine

## 2019-12-31 ENCOUNTER — Inpatient Hospital Stay: Payer: Managed Care, Other (non HMO) | Attending: Internal Medicine | Admitting: Internal Medicine

## 2019-12-31 ENCOUNTER — Inpatient Hospital Stay: Payer: Managed Care, Other (non HMO)

## 2019-12-31 ENCOUNTER — Other Ambulatory Visit: Payer: Self-pay

## 2019-12-31 VITALS — BP 122/84 | HR 67

## 2019-12-31 DIAGNOSIS — K909 Intestinal malabsorption, unspecified: Secondary | ICD-10-CM | POA: Diagnosis not present

## 2019-12-31 DIAGNOSIS — Z9884 Bariatric surgery status: Secondary | ICD-10-CM | POA: Diagnosis not present

## 2019-12-31 DIAGNOSIS — D508 Other iron deficiency anemias: Secondary | ICD-10-CM

## 2019-12-31 DIAGNOSIS — K9589 Other complications of other bariatric procedure: Secondary | ICD-10-CM

## 2019-12-31 DIAGNOSIS — D509 Iron deficiency anemia, unspecified: Secondary | ICD-10-CM

## 2019-12-31 MED ORDER — SODIUM CHLORIDE 0.9 % IV SOLN
510.0000 mg | Freq: Once | INTRAVENOUS | Status: AC
  Administered 2019-12-31: 510 mg via INTRAVENOUS
  Filled 2019-12-31: qty 17

## 2019-12-31 MED ORDER — SODIUM CHLORIDE 0.9 % IV SOLN
Freq: Once | INTRAVENOUS | Status: AC
  Filled 2019-12-31: qty 250

## 2019-12-31 NOTE — Progress Notes (Signed)
[facility] OFFICE PROGRESS NOTE  Patient Care Team: Kelly Seltzer, MD as PCP - General   SUMMARY OF ONCOLOGIC HISTORY:  # 2006- IRON DEFICIENCY ANEMIA sec Malabsorption/ gastric bypass, Roux-en-Y; IV Ferrahem every 6 months-maintenance  #Hypothyroidism  INTERVAL HISTORY:  44 year old female patient with a history of gastric bypass/malabsorption is currently IV Feraheme every 6 months is here for follow-up.  Patient denies any nausea vomiting abdominal pain.  Complains of fatigue.  She is not taking B12 pills.   Review of Systems  Constitutional: Positive for malaise/fatigue. Negative for chills, diaphoresis, fever and weight loss.  HENT: Negative for nosebleeds and sore throat.   Eyes: Negative for double vision.  Respiratory: Negative for cough, hemoptysis, sputum production, shortness of breath and wheezing.   Cardiovascular: Negative for chest pain, palpitations, orthopnea and leg swelling.  Gastrointestinal: Negative for abdominal pain, blood in stool, constipation, diarrhea, heartburn, melena, nausea and vomiting.  Genitourinary: Negative for dysuria, frequency and urgency.  Musculoskeletal: Negative for back pain and joint pain.  Neurological: Negative for dizziness, tingling, focal weakness, weakness and headaches.  Endo/Heme/Allergies: Does not bruise/bleed easily.  Psychiatric/Behavioral: Negative for depression. The patient is not nervous/anxious and does not have insomnia.      PAST MEDICAL HISTORY :  Past Medical History:  Diagnosis Date  . Allergy   . Asthma   . Depression   . Hypothyroidism   . Iron deficiency anemia   . PONV (postoperative nausea and vomiting)    HARD TIME WAKING AFTER ENDOSCOPY  . Postprandial hypoglycemia     PAST SURGICAL HISTORY :   Past Surgical History:  Procedure Laterality Date  . CESAREAN SECTION    . CHONDROPLASTY Left 12/23/2017   Procedure: CHONDROPLASTY;  Surgeon: Kelly Kell, MD;  Location: [facility];   Service: Orthopedics;  Laterality: Left;  Marland Kitchen GASTRIC BYPASS    . KNEE ARTHROSCOPY Left 12/23/2017   Procedure: ARTHROSCOPY KNEE WITH LOOSE BODY REMOVAL;  Surgeon: Kelly Kell, MD;  Location: [facility];  Service: Orthopedics;  Laterality: Left;    FAMILY HISTORY :   Family History  Problem Relation Age of Onset  . Arthritis Mother   . Cervical cancer Mother   . Alcoholism Paternal Grandfather   . Stomach cancer Paternal Grandfather   . Heart disease Maternal Grandfather   . Diabetes Maternal Grandmother     SOCIAL HISTORY:   Social History   Tobacco Use  . Smoking status: Never Smoker  . Smokeless tobacco: Never Used  Substance Use Topics  . Alcohol use: No    Alcohol/week: 0.0 standard drinks  . Drug use: No    ALLERGIES:  is allergic to morphine and related.  MEDICATIONS:  Current Outpatient Medications  Medication Sig Dispense Refill  . levothyroxine (SYNTHROID) 100 MCG tablet TAKE 1 TABLET BY MOUTH EVERY DAY 30 tablet 8  . clobetasol cream (TEMOVATE) 0.05 % Apply 1 application topically 2 (two) times daily. 45 g 0   No current facility-administered medications for this visit.    PHYSICAL EXAMINATION: ECOG PERFORMANCE STATUS: 1 - Symptomatic but completely ambulatory  BP 119/80   Pulse 63   Temp 99.3 F (37.4 C) (Tympanic)   Resp 20   LMP 12/15/2019   There were no vitals filed for this visit.  Physical Exam  Constitutional: She is oriented to person, place, and time and well-developed, well-nourished, and in no distress.  HENT:  Head: Normocephalic and atraumatic.  Mouth/Throat: Oropharynx is clear and moist. No oropharyngeal exudate.  Eyes: Pupils are equal, round, and reactive to light.  Cardiovascular: Normal rate and regular rhythm.  Pulmonary/Chest: No respiratory distress. She has no wheezes.  Abdominal: Soft. Bowel sounds are normal. She exhibits no distension and no mass. There is no abdominal tenderness. There is no rebound and no guarding.   Musculoskeletal:        General: No tenderness or edema. Normal range of motion.     Cervical back: Normal range of motion and neck supple.  Neurological: She is alert and oriented to person, place, and time.  Skin: Skin is warm.  Psychiatric: Affect normal.     LABORATORY DATA:  I have reviewed the data as listed    Component Value Date/Time   NA 138 12/21/2019 1625   NA 137 04/25/2014 1534   K 4.3 12/21/2019 1625   K 4.7 04/25/2014 1534   CL 102 12/21/2019 1625   CL 106 04/25/2014 1534   CO2 23 12/21/2019 1625   CO2 22 04/25/2014 1534   GLUCOSE 75 12/21/2019 1625   GLUCOSE 90 04/25/2014 1534   BUN 10 12/21/2019 1625   BUN 11 04/25/2014 1534   CREATININE 0.57 12/21/2019 1625   CREATININE 0.67 04/25/2014 1534   CALCIUM 8.6 (L) 12/21/2019 1625   CALCIUM 8.1 (L) 04/25/2014 1534   PROT 6.7 12/21/2019 1625   ALBUMIN 3.9 12/21/2019 1625   AST 15 12/21/2019 1625   ALT 9 12/21/2019 1625   ALKPHOS 69 12/21/2019 1625   BILITOT 0.4 12/21/2019 1625   GFRNONAA 115 12/21/2019 1625   GFRNONAA >60 04/25/2014 1534   GFRAA 132 12/21/2019 1625   GFRAA >60 04/25/2014 1534    No results found for: SPEP, UPEP  Lab Results  Component Value Date   WBC 4.6 11/11/2017   NEUTROABS 2.8 11/11/2017   HGB 13.1 11/11/2017   HCT 38.8 11/11/2017   MCV 90 11/11/2017   PLT 262 11/11/2017      Chemistry      Component Value Date/Time   NA 138 12/21/2019 1625   NA 137 04/25/2014 1534   K 4.3 12/21/2019 1625   K 4.7 04/25/2014 1534   CL 102 12/21/2019 1625   CL 106 04/25/2014 1534   CO2 23 12/21/2019 1625   CO2 22 04/25/2014 1534   BUN 10 12/21/2019 1625   BUN 11 04/25/2014 1534   CREATININE 0.57 12/21/2019 1625   CREATININE 0.67 04/25/2014 1534   GLU 129 07/25/2019 0000      Component Value Date/Time   CALCIUM 8.6 (L) 12/21/2019 1625   CALCIUM 8.1 (L) 04/25/2014 1534   ALKPHOS 69 12/21/2019 1625   AST 15 12/21/2019 1625   ALT 9 12/21/2019 1625   BILITOT 0.4 12/21/2019 1625          ASSESSMENT & PLAN:   Iron deficiency anemia following bariatric surgery # Iron Deficiency 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 Iron saturation 10% ;Ferritin 7  #As patient is symptomatic proceed with IV Feraheme.  # B12 def- reminded Sublingual B12/ vit D-   # DISPOSITION:  # Ferrahem today # follow up in 6 months- MD [[facility]/ferritin/iron studies/b12]- possible ferrahem-Dr.B      Cammie Sickle, MD 12/31/2019 1:41 PM

## 2019-12-31 NOTE — Assessment & Plan Note (Signed)
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 Iron saturation 10% ;Ferritin 7  #As patient is symptomatic proceed with IV Feraheme.  # B12 def- reminded Sublingual B12/ vit D-   # DISPOSITION:  # Ferrahem today # follow up in 6 months- MD [[facility]/ferritin/iron studies/b12]- possible ferrahem-Dr.B

## 2020-01-18 ENCOUNTER — Ambulatory Visit
Admission: RE | Admit: 2020-01-18 | Discharge: 2020-01-18 | Disposition: A | Discharge status: Home / Self Care | Payer: Managed Care, Other (non HMO) | Source: Ambulatory Visit | Attending: Family Medicine | Admitting: Family Medicine

## 2020-01-18 DIAGNOSIS — Z1231 Encounter for screening mammogram for malignant neoplasm of breast: Secondary | ICD-10-CM | POA: Insufficient documentation

## 2020-01-21 ENCOUNTER — Other Ambulatory Visit: Payer: Self-pay | Admitting: Family Medicine

## 2020-01-21 DIAGNOSIS — N6310 Unspecified lump in the right breast, unspecified quadrant: Secondary | ICD-10-CM

## 2020-01-21 DIAGNOSIS — R928 Other abnormal and inconclusive findings on diagnostic imaging of breast: Secondary | ICD-10-CM

## 2020-01-25 NOTE — Assessment & Plan Note (Signed)
Doing well on no medication. 

## 2020-01-25 NOTE — Assessment & Plan Note (Signed)
Encouraged exercise, weight loss, healthy eating habits. ? ?

## 2020-01-25 NOTE — Assessment & Plan Note (Signed)
Previously stable control on levothyroxine 100 mcg but due for re-eval.

## 2020-02-01 ENCOUNTER — Ambulatory Visit
Admission: RE | Admit: 2020-02-01 | Discharge: 2020-02-01 | Disposition: A | Discharge status: Home / Self Care | Payer: Managed Care, Other (non HMO) | Source: Ambulatory Visit | Attending: Family Medicine | Admitting: Family Medicine

## 2020-02-01 DIAGNOSIS — N6310 Unspecified lump in the right breast, unspecified quadrant: Secondary | ICD-10-CM | POA: Diagnosis present

## 2020-02-01 DIAGNOSIS — R928 Other abnormal and inconclusive findings on diagnostic imaging of breast: Secondary | ICD-10-CM

## 2020-02-10 ENCOUNTER — Ambulatory Visit: Payer: Managed Care, Other (non HMO) | Attending: Internal Medicine

## 2020-02-10 DIAGNOSIS — Z23 Encounter for immunization: Secondary | ICD-10-CM | POA: Insufficient documentation

## 2020-02-10 NOTE — Progress Notes (Signed)
   Covid-19 Vaccination Clinic  Name:  DERRIAN RODAK    MRN: [id number] DOB: [birth year]  02/10/2020  Ms. Clelia Croft was observed post Covid-19 immunization for 15 minutes without incident. She was provided with Vaccine Information Sheet and instruction to access the V-Safe system.   Ms. Clelia Croft was instructed to call 911 with any severe reactions post vaccine: Marland Kitchen Difficulty breathing  . Swelling of face and throat  . A fast heartbeat  . A bad rash all over body  . Dizziness and weakness   Immunizations Administered    Name Date Dose VIS Date Route   Pfizer COVID-19 Vaccine 02/10/2020  2:25 PM 0.3 mL 11/16/2019 Intramuscular   Manufacturer: [employer], [employer]   Lot: [id number]   NDC: [address]-4

## 2020-03-04 ENCOUNTER — Ambulatory Visit: Payer: Managed Care, Other (non HMO) | Attending: Internal Medicine

## 2020-03-04 DIAGNOSIS — Z23 Encounter for immunization: Secondary | ICD-10-CM

## 2020-03-04 NOTE — Progress Notes (Signed)
   Covid-19 Vaccination Clinic  Name:  Kelly Barr    MRN: [id number] DOB: 06-27-1976  03/04/2020  Ms. Kelly Barr was observed post Covid-19 immunization for 15 minutes without incident. She was provided with Vaccine Information Sheet and instruction to access the V-Safe system.   Ms. Kelly Barr was instructed to call 911 with any severe reactions post vaccine: Marland Kitchen Difficulty breathing  . Swelling of face and throat  . A fast heartbeat  . A bad rash all over body  . Dizziness and weakness   Immunizations Administered    Name Date Dose VIS Date Route   Pfizer COVID-19 Vaccine 03/04/2020  2:03 PM 0.3 mL 11/16/2019 Intramuscular   Manufacturer: [employer], [employer]   Lot: [id number]   NDC: [address]-8

## 2020-06-23 ENCOUNTER — Encounter: Payer: Self-pay | Admitting: Internal Medicine

## 2020-06-27 ENCOUNTER — Other Ambulatory Visit: Payer: Self-pay

## 2020-06-27 DIAGNOSIS — D508 Other iron deficiency anemias: Secondary | ICD-10-CM

## 2020-06-27 DIAGNOSIS — D509 Iron deficiency anemia, unspecified: Secondary | ICD-10-CM

## 2020-06-30 ENCOUNTER — Inpatient Hospital Stay: Payer: Managed Care, Other (non HMO) | Attending: Internal Medicine | Admitting: Internal Medicine

## 2020-06-30 ENCOUNTER — Inpatient Hospital Stay: Payer: Managed Care, Other (non HMO)

## 2020-06-30 ENCOUNTER — Other Ambulatory Visit: Payer: Self-pay

## 2020-06-30 VITALS — BP 102/58 | HR 68 | Temp 98.4°F | Resp 20

## 2020-06-30 DIAGNOSIS — K9589 Other complications of other bariatric procedure: Secondary | ICD-10-CM

## 2020-06-30 DIAGNOSIS — Z9884 Bariatric surgery status: Secondary | ICD-10-CM | POA: Insufficient documentation

## 2020-06-30 DIAGNOSIS — D508 Other iron deficiency anemias: Secondary | ICD-10-CM

## 2020-06-30 DIAGNOSIS — K909 Intestinal malabsorption, unspecified: Secondary | ICD-10-CM | POA: Diagnosis not present

## 2020-06-30 DIAGNOSIS — D509 Iron deficiency anemia, unspecified: Secondary | ICD-10-CM | POA: Insufficient documentation

## 2020-06-30 MED ORDER — SODIUM CHLORIDE 0.9 % IV SOLN
Freq: Once | INTRAVENOUS | Status: AC
  Filled 2020-06-30: qty 250

## 2020-06-30 MED ORDER — SODIUM CHLORIDE 0.9 % IV SOLN
510.0000 mg | Freq: Once | INTRAVENOUS | Status: AC
  Administered 2020-06-30: 510 mg via INTRAVENOUS
  Filled 2020-06-30: qty 510

## 2020-06-30 NOTE — Assessment & Plan Note (Addendum)
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  # B12 def- Sublingual B12/ vit D- stable.   # DISPOSITION:  # Ferrahem today # follow up in 6 months- MD [[facility]/ferritin/iron studies/b12]- possible ferrahem-Dr.B

## 2020-06-30 NOTE — Progress Notes (Signed)
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  Patient in the interim got married.  Denies any nausea vomiting abdominal pain.  Complains of fatigue.  Blood in stools or black or stools.  Review of Systems  Constitutional: Positive for malaise/fatigue. Negative for chills, diaphoresis, fever and weight loss.  HENT: Negative for nosebleeds and sore throat.   Eyes: Negative for double vision.  Respiratory: Negative for cough, hemoptysis, sputum production, shortness of breath and wheezing.   Cardiovascular: Negative for chest pain, palpitations, orthopnea and leg swelling.  Gastrointestinal: Negative for abdominal pain, blood in stool, constipation, diarrhea, heartburn, melena, nausea and vomiting.  Genitourinary: Negative for dysuria, frequency and urgency.  Musculoskeletal: Negative for back pain and joint pain.  Neurological: Negative for dizziness, tingling, focal weakness, weakness and headaches.  Endo/Heme/Allergies: Does not bruise/bleed easily.  Psychiatric/Behavioral: Negative for depression. The patient is not nervous/anxious and does not have insomnia.      PAST MEDICAL HISTORY :  Past Medical History:  Diagnosis Date  . Allergy   . Asthma   . Depression   . Hypothyroidism   . Iron deficiency anemia   . PONV (postoperative nausea and vomiting)    HARD TIME WAKING AFTER ENDOSCOPY  . Postprandial hypoglycemia     PAST SURGICAL HISTORY :   Past Surgical History:  Procedure Laterality Date  . CESAREAN SECTION    . CHONDROPLASTY Left 12/23/2017   Procedure: CHONDROPLASTY;  Surgeon:  Signa Kell, MD;  Location: [facility];  Service: Orthopedics;  Laterality: Left;  Marland Kitchen GASTRIC BYPASS    . KNEE ARTHROSCOPY Left 12/23/2017   Procedure: ARTHROSCOPY KNEE WITH LOOSE BODY REMOVAL;  Surgeon: Signa Kell, MD;  Location: [facility] OR[facility];  Service: Orthopedics;  Laterality: Left;    FAMILY HISTORY :   Family History  Problem Relation Age of Onset  . Arthritis Mother   . Cervical cancer Mother   . Alcoholism Paternal Grandfather   . Stomach cancer Paternal Grandfather   . Heart disease Maternal Grandfather   . Diabetes Maternal Grandmother   . Breast cancer Neg Hx     SOCIAL HISTORY:   Social History   Tobacco Use  . Smoking status: Never Smoker  . Smokeless tobacco: Never Used  Vaping Use  . Vaping Use: Never used  Substance Use Topics  . Alcohol use: No    Alcohol/week: 0.0 standard drinks  . Drug use: No    ALLERGIES:  is allergic to morphine and related.  MEDICATIONS:  Current Outpatient Medications  Medication Sig Dispense Refill  . clobetasol cream (TEMOVATE) 0.05 % Apply 1 application topically 2 (two) times daily. 45 g 0  . levothyroxine (SYNTHROID) 100 MCG tablet TAKE 1 TABLET BY MOUTH EVERY DAY 30 tablet 8   No current facility-administered medications for this visit.    PHYSICAL EXAMINATION: ECOG PERFORMANCE STATUS: 1 - Symptomatic but completely ambulatory  BP 114/67 (BP Location: Right Arm, Patient Position: Sitting)   Pulse 71   Temp 98.2 F (36.8 C) (Tympanic)   Resp 20   Ht 5' 6.5" (1.689 m)   Wt (!) 280 lb (127 kg)   BMI 44.52 kg/m   [facility]  [employer]   06/30/20 1333  Weight: (!) 280 lb (127 kg)    Physical Exam HENT:     Head: Normocephalic and atraumatic.     Mouth/Throat:     Pharynx: No oropharyngeal exudate.  Eyes:     Pupils: Pupils are equal, round, and reactive to light.  Cardiovascular:     Rate and Rhythm: Normal rate and regular rhythm.  Pulmonary:     Effort: No respiratory distress.     Breath sounds: No wheezing.   Abdominal:     General: Bowel sounds are normal. There is no distension.     Palpations: Abdomen is soft. There is no mass.     Tenderness: There is no abdominal tenderness. There is no guarding or rebound.  Musculoskeletal:        General: No tenderness. Normal range of motion.     Cervical back: Normal range of motion and neck supple.  Skin:    General: Skin is warm.  Neurological:     Mental Status: She is alert and oriented to person, place, and time.  Psychiatric:        Mood and Affect: Affect normal.      LABORATORY DATA:  I have reviewed the data as listed    Component Value Date/Time   NA 138 12/21/2019 1625   NA 137 04/25/2014 1534   K 4.3 12/21/2019 1625   K 4.7 04/25/2014 1534   CL 102 12/21/2019 1625   CL 106 04/25/2014 1534   CO2 23 12/21/2019 1625   CO2 22 04/25/2014 1534   GLUCOSE 75 12/21/2019 1625   GLUCOSE 90 04/25/2014 1534   BUN 10 12/21/2019 1625   BUN 11 04/25/2014 1534   CREATININE 0.57 12/21/2019 1625   CREATININE 0.67 04/25/2014 1534   CALCIUM 8.6 (L) 12/21/2019 1625   CALCIUM 8.1 (L) 04/25/2014 1534   PROT 6.7 12/21/2019 1625   ALBUMIN 3.9 12/21/2019 1625   AST 15 12/21/2019 1625   ALT 9 12/21/2019 1625   ALKPHOS 69 12/21/2019 1625   BILITOT 0.4 12/21/2019 1625   GFRNONAA 115 12/21/2019 1625   GFRNONAA >60 04/25/2014 1534   GFRAA 132 12/21/2019 1625   GFRAA >60 04/25/2014 1534    No results found for: SPEP, UPEP  Lab Results  Component Value Date   WBC 4.6 11/11/2017   NEUTROABS 2.8 11/11/2017   HGB 13.1 11/11/2017   HCT 38.8 11/11/2017   MCV 90 11/11/2017   PLT 262 11/11/2017      Chemistry      Component Value Date/Time   NA 138 12/21/2019 1625   NA 137 04/25/2014 1534   K 4.3 12/21/2019 1625   K 4.7 04/25/2014 1534   CL 102 12/21/2019 1625   CL 106 04/25/2014 1534   CO2 23 12/21/2019 1625   CO2 22 04/25/2014 1534   BUN 10 12/21/2019 1625   BUN 11 04/25/2014 1534   CREATININE 0.57 12/21/2019 1625   CREATININE  0.67 04/25/2014 1534   GLU 129 07/25/2019 0000      Component Value Date/Time   CALCIUM 8.6 (L) 12/21/2019 1625   CALCIUM 8.1 (L) 04/25/2014 1534   ALKPHOS 69 12/21/2019 1625   AST 15 12/21/2019 1625   ALT 9 12/21/2019 1625   BILITOT 0.4 12/21/2019 1625         ASSESSMENT & PLAN:   Iron deficiency anemia following bariatric surgery # Iron Deficiency 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  # B12 def- Sublingual B12/ vit D- stable.   # DISPOSITION:  # Ferrahem today # follow up in 6 months- MD [[facility]/ferritin/iron studies/b12]- possible ferrahem-Dr.B      Earna Coder, MD 06/30/2020 1:48 PM

## 2020-11-04 ENCOUNTER — Other Ambulatory Visit: Payer: Self-pay

## 2020-11-04 ENCOUNTER — Ambulatory Visit
Admission: EM | Admit: 2020-11-04 | Discharge: 2020-11-04 | Disposition: A | Discharge status: Home / Self Care | Payer: Managed Care, Other (non HMO) | Attending: Emergency Medicine | Admitting: Emergency Medicine

## 2020-11-04 ENCOUNTER — Encounter: Payer: Self-pay | Admitting: Emergency Medicine

## 2020-11-04 DIAGNOSIS — J069 Acute upper respiratory infection, unspecified: Secondary | ICD-10-CM

## 2020-11-04 DIAGNOSIS — Z1152 Encounter for screening for COVID-19: Secondary | ICD-10-CM

## 2020-11-04 NOTE — Discharge Instructions (Signed)
Your COVID test is pending.  You should self quarantine until the test result is back.    Take Tylenol or ibuprofen as needed for fever or discomfort; Mucinex as needed for congestion.  Rest and keep yourself hydrated.    Follow-up with your primary care provider if your symptoms are not improving.     

## 2020-11-04 NOTE — ED Provider Notes (Signed)
Renaldo Fiddler    CSN: [id number] Arrival date & time: 11/04/20  1021      History   Chief Complaint Chief Complaint  Patient presents with  . Nasal Congestion  . Otalgia    HPI Kelly Barr is a 44 y.o. female.   Patient presents with headache, earache, nasal congestion, sore throat x 3-4 days.  She denies fever, chills, cough, shortness of breath, vomiting, diarrhea, rash, other symptoms.  Treatment attempted at home with OTC allergy medication.  Her medical history includes morbid obesity, hypothyroid, anemia, depression, asthma.  The history is provided by the patient and medical records.    Past Medical History:  Diagnosis Date  . Allergy   . Asthma   . Depression   . Hypothyroidism   . Iron deficiency anemia   . PONV (postoperative nausea and vomiting)    HARD TIME WAKING AFTER ENDOSCOPY  . Postprandial hypoglycemia     Patient Active Problem List   Diagnosis Date Noted  . Psoriasis 09/18/2019  . Screening cholesterol level 09/27/2017  . Hypothyroidism 09/04/2009  . Morbid obesity (HCC) 09/04/2009  . Iron deficiency anemia following bariatric surgery 09/04/2009  . 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 ALLERGIC RHINITIS DUE TO POLLEN 09/04/2009  . ASTHMA, INTERMITTENT, MILD 09/04/2009  . 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 CESAREAN SECTION    . CHONDROPLASTY Left 12/23/2017   Procedure: CHONDROPLASTY;  Surgeon: Signa Kell, MD;  Location: [facility] OR[facility];  Service: Orthopedics;  Laterality: Left;  Marland Kitchen GASTRIC BYPASS    . KNEE ARTHROSCOPY Left 12/23/2017   Procedure: ARTHROSCOPY KNEE WITH LOOSE BODY REMOVAL;  Surgeon: Signa Kell, MD;  Location: [facility] OR[facility];  Service: Orthopedics;  Laterality: Left;    OB History   No obstetric history on file.      Home Medications    Prior to Admission medications   Medication Sig Start Date End Date Taking? Authorizing Provider  levothyroxine  (SYNTHROID) 100 MCG tablet TAKE 1 TABLET BY MOUTH EVERY DAY 12/25/19  Yes Bedsole, Amy E, MD  clobetasol cream (TEMOVATE) 0.05 % Apply 1 application topically 2 (two) times daily. 09/18/19   Excell Seltzer, MD    Family History Family History  Problem Relation Age of Onset  . Arthritis Mother   . Cervical cancer Mother   . Alcoholism Paternal Grandfather   . Stomach cancer Paternal Grandfather   . Heart disease Maternal Grandfather   . Diabetes Maternal Grandmother   . Breast cancer Neg Hx     Social History Social History   Tobacco Use  . Smoking status: Never Smoker  . Smokeless tobacco: Never Used  Vaping Use  . Vaping Use: Never used  Substance Use Topics  . Alcohol use: No    Alcohol/week: 0.0 standard drinks  . Drug use: No     Allergies   Morphine and related   Review of Systems Review of Systems  Constitutional: Negative for chills and fever.  HENT: Positive for congestion, ear pain and sore throat.   Eyes: Negative for pain and visual disturbance.  Respiratory: Negative for cough and shortness of breath.   Cardiovascular: Negative for chest pain and palpitations.  Gastrointestinal: Negative for abdominal pain, diarrhea and vomiting.  Genitourinary: Negative for dysuria and hematuria.  Musculoskeletal: Negative for arthralgias and back pain.  Skin: Negative for color change and rash.  Neurological: Negative for seizures and syncope.  All other systems reviewed and  are negative.    Physical Exam Triage Vital Signs ED Triage Vitals  Enc Vitals Group     BP 11/04/20 1036 114/82     Pulse Rate 11/04/20 1036 74     Resp 11/04/20 1036 18     Temp 11/04/20 1036 98 F (36.7 C)     Temp Source 11/04/20 1036 Oral     SpO2 11/04/20 1036 98 %     Weight --      Height --      Head Circumference --      Peak Flow --      Pain Score 11/04/20 1044 4     Pain Loc --      Pain Edu? --      Excl. in GC? --    No data found.  Updated Vital Signs BP 114/82  (BP Location: Left Arm)   Pulse 74   Temp 98 F (36.7 C) (Oral)   Resp 18   LMP 10/25/2020   SpO2 98%   Visual Acuity Right Eye Distance:   Left Eye Distance:   Bilateral Distance:    Right Eye Near:   Left Eye Near:    Bilateral Near:     Physical Exam Vitals and nursing note reviewed.  Constitutional:      General: She is not in acute distress.    Appearance: She is well-developed. She is obese. She is not ill-appearing.  HENT:     Head: Normocephalic and atraumatic.     Right Ear: Tympanic membrane normal.     Left Ear: Tympanic membrane normal.     Nose: Nose normal.     Mouth/Throat:     Mouth: Mucous membranes are moist.     Pharynx: Oropharynx is clear.  Eyes:     Conjunctiva/sclera: Conjunctivae normal.  Cardiovascular:     Rate and Rhythm: Normal rate and regular rhythm.     Heart sounds: Normal heart sounds.  Pulmonary:     Effort: Pulmonary effort is normal. No respiratory distress.     Breath sounds: Normal breath sounds. No wheezing or rhonchi.  Abdominal:     Palpations: Abdomen is soft.     Tenderness: There is no abdominal tenderness.  Musculoskeletal:     Cervical back: Neck supple.  Skin:    General: Skin is warm and dry.     Findings: No rash.  Neurological:     General: No focal deficit present.     Mental Status: She is alert and oriented to person, place, and time.     Gait: Gait normal.  Psychiatric:        Mood and Affect: Mood normal.        Behavior: Behavior normal.      UC Treatments / Results  Labs (all labs ordered are listed, but only abnormal results are displayed) Labs Reviewed - No data to display  EKG   Radiology No results found.  Procedures Procedures (including critical care time)  Medications Ordered in UC Medications - No data to display  Initial Impression / Assessment and Plan / UC Course  I have reviewed the triage vital signs and the nursing notes.  Pertinent labs & imaging results that were  available during my care of the patient were reviewed by me and considered in my medical decision making (see chart for details).   Viral URI.  PCR COVID pending.  Instructed patient to self quarantine until the test result is back.  Discussed symptomatic treatment  including Tylenol, Mucinex, rest, hydration.  Instructed patient to follow up with PCP if her symptoms are not improving  Patient agrees to plan of care.    Final Clinical Impressions(s) / UC Diagnoses   Final diagnoses:  Viral URI     Discharge Instructions     Your COVID test is pending.  You should self quarantine until the test result is back.    Take Tylenol or ibuprofen as needed for fever or discomfort; Mucinex as needed for congestion.  Rest and keep yourself hydrated.    Follow-up with your primary care provider if your symptoms are not improving.        ED Prescriptions    None     PDMP not reviewed this encounter.   Mickie Bail, NP 11/04/20 1101

## 2020-11-04 NOTE — ED Triage Notes (Addendum)
Patient c/o bilateral ear pain, nasal congestion, headache, and sore throat x since 3 days.   Patient denies fever or cough.   Patient has tried allergy medication and saline rinse with no relief of symptoms.

## 2020-11-05 ENCOUNTER — Other Ambulatory Visit: Payer: Self-pay

## 2020-11-05 LAB — SARS-COV-2, NAA 2 DAY TAT

## 2020-11-05 LAB — NOVEL CORONAVIRUS, NAA: SARS-CoV-2, NAA: NOT DETECTED

## 2020-11-05 MED ORDER — CLOBETASOL PROPIONATE 0.05 % EX CREA: 1.0000 "application " | TOPICAL_CREAM | Freq: Two times a day (BID) | CUTANEOUS | 0 refills | Status: DC

## 2020-11-05 NOTE — Telephone Encounter (Signed)
Last office visit 12/21/2019 for CPE.  Last refilled 09/18/2019 for 45 g with no refills.  No future appointments with PCP.

## 2020-11-19 IMAGING — US US BREAST*R* LIMITED INC AXILLA
1 series · 5 of 5 positions shown · non-contrast
Comparison: 01/18/2020, baseline

CLINICAL DATA: Patient returns after screening study for evaluation
of possible RIGHT breast mass.

EXAM:
DIGITAL DIAGNOSTIC RIGHT MAMMOGRAM WITH CAD AND TOMO
ULTRASOUND RIGHT BREAST

[Series 1: us breast*right* limited inc axilla · 0.06mm/px · 5 of 5 slices shown]
[im 1/5]
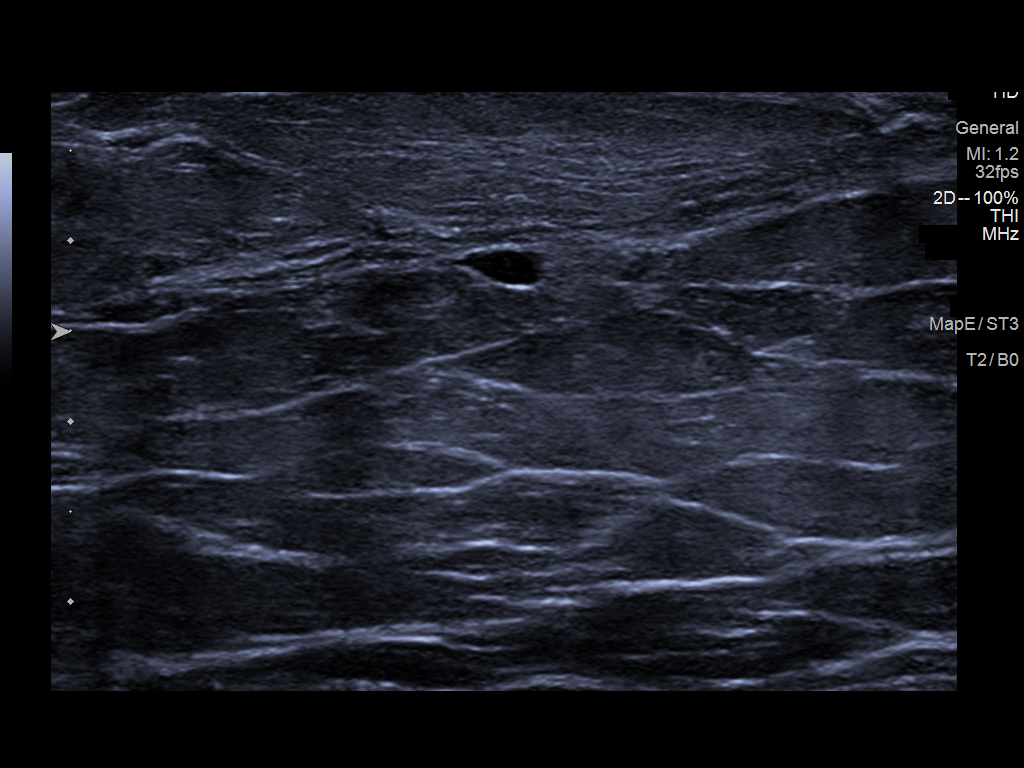
[im 2/5]
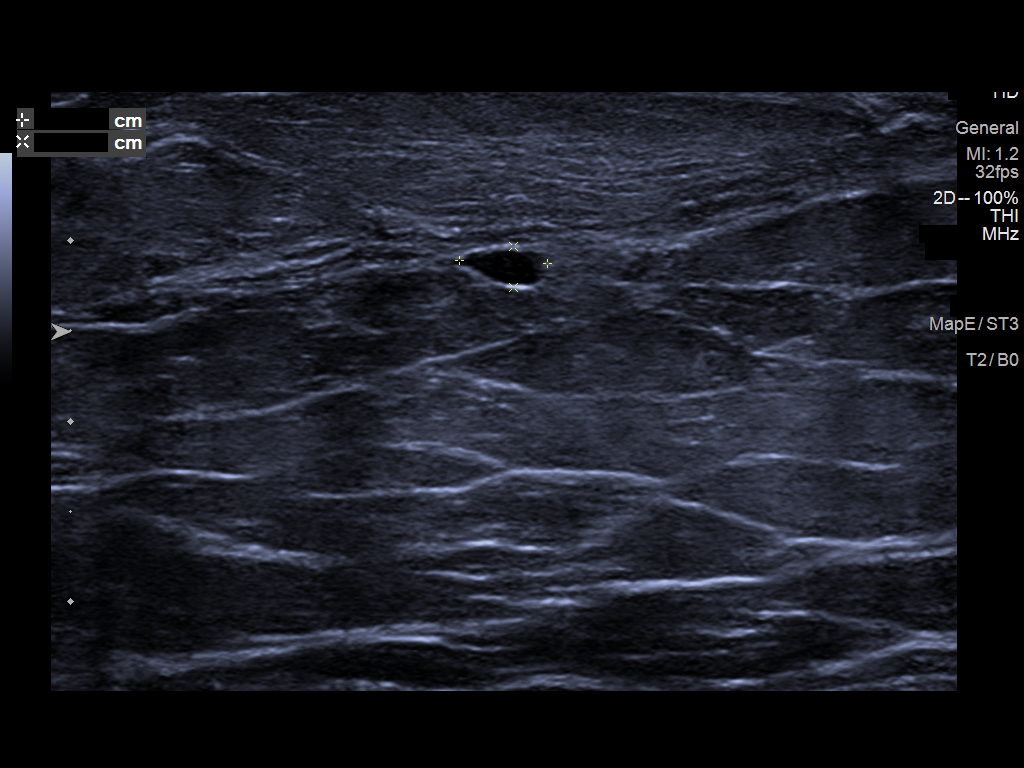
[im 3/5]
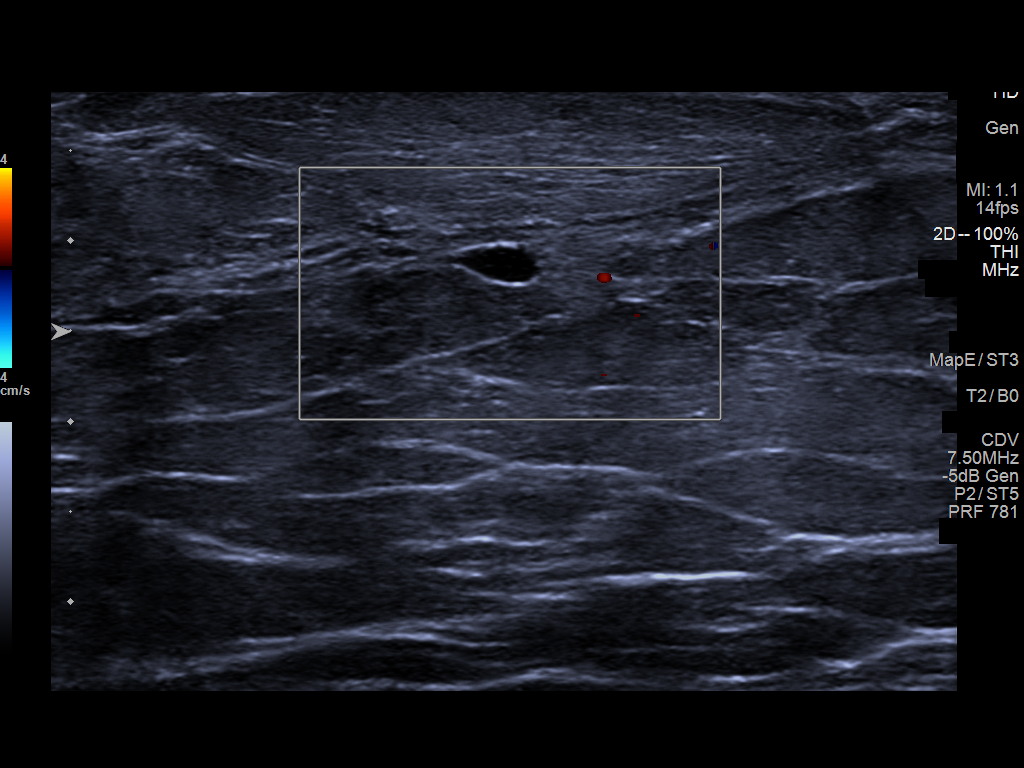
[im 4/5]
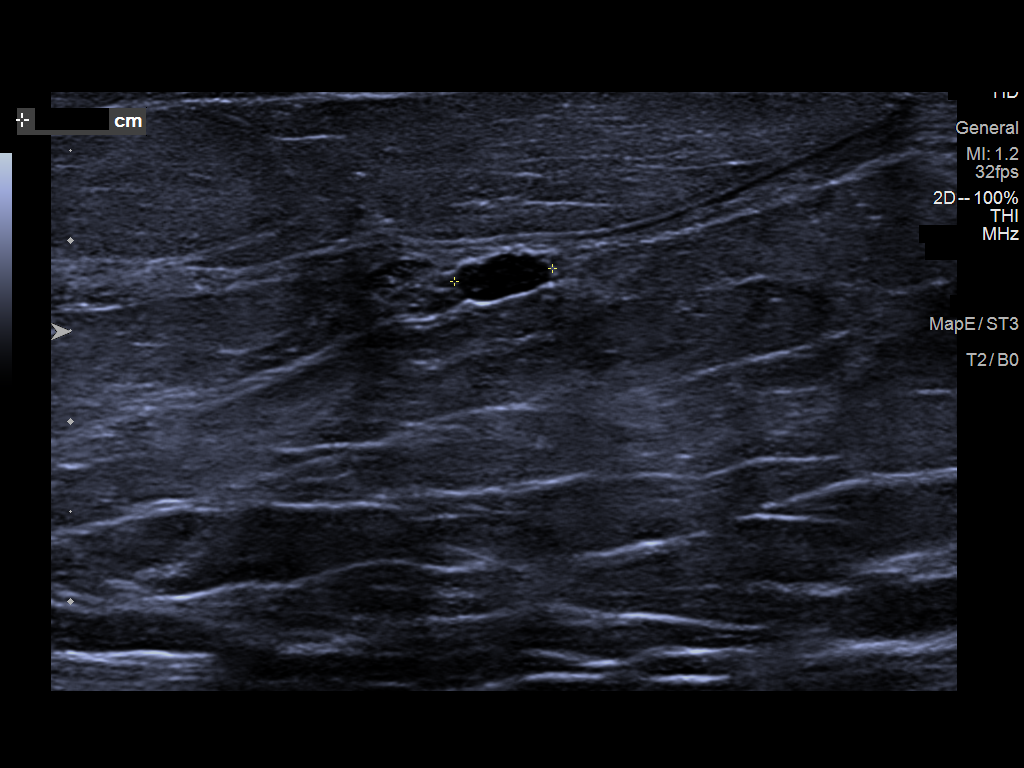
[im 5/5]
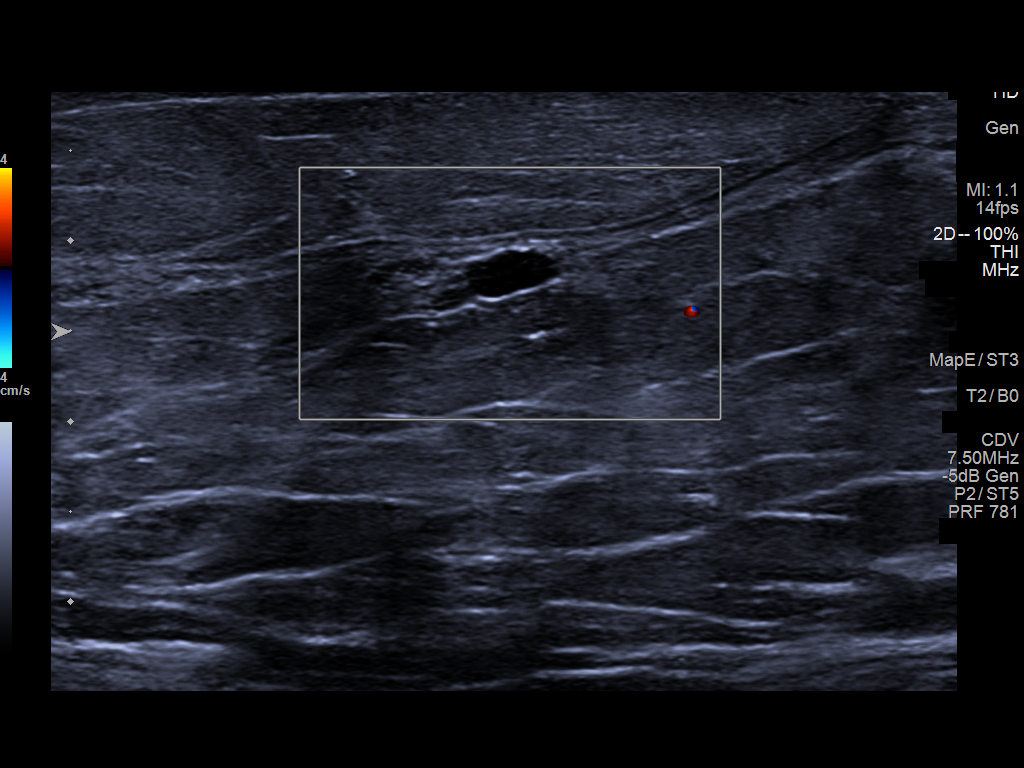

[5 of 5 positions shown; findings below may reference images not displayed]

ACR Breast Density Category b: There are scattered areas of
fibroglandular density.
FINDINGS: Additional 2-D and 3-D images are performed. These views confirm
presence of a circumscribed oval mass in the UPPER-OUTER QUADRANT of
the RIGHT breast.

Mammographic images were processed with CAD.

Targeted ultrasound is performed, showing a simple cyst in the 10
o'clock location of the RIGHT breast 2 centimeters from the nipple
which measures 0.5 x 0.2 x 0.6 centimeters. No solid masses or areas
of acoustic shadowing.
IMPRESSION: Benign cyst in the RIGHT breast accounting for the abnormality. No
mammographic or ultrasound evidence for malignancy.

RECOMMENDATION:
Screening mammogram in one year.(Code:35-V-TJZ)

I have discussed the findings and recommendations with the patient.
If applicable, a reminder letter will be sent to the patient
regarding the next appointment.

BI-RADS CATEGORY  2: Benign.

## 2020-11-19 IMAGING — MG MM DIGITAL DIAGNOSTIC UNILAT*R* W/ TOMO W/ CAD
8 series · 8 of 24 positions shown · non-contrast
Comparison: 01/18/2020, baseline

CLINICAL DATA: Patient returns after screening study for evaluation
of possible RIGHT breast mass.

EXAM:
DIGITAL DIAGNOSTIC RIGHT MAMMOGRAM WITH CAD AND TOMO
ULTRASOUND RIGHT BREAST

[R ML synth-2D]
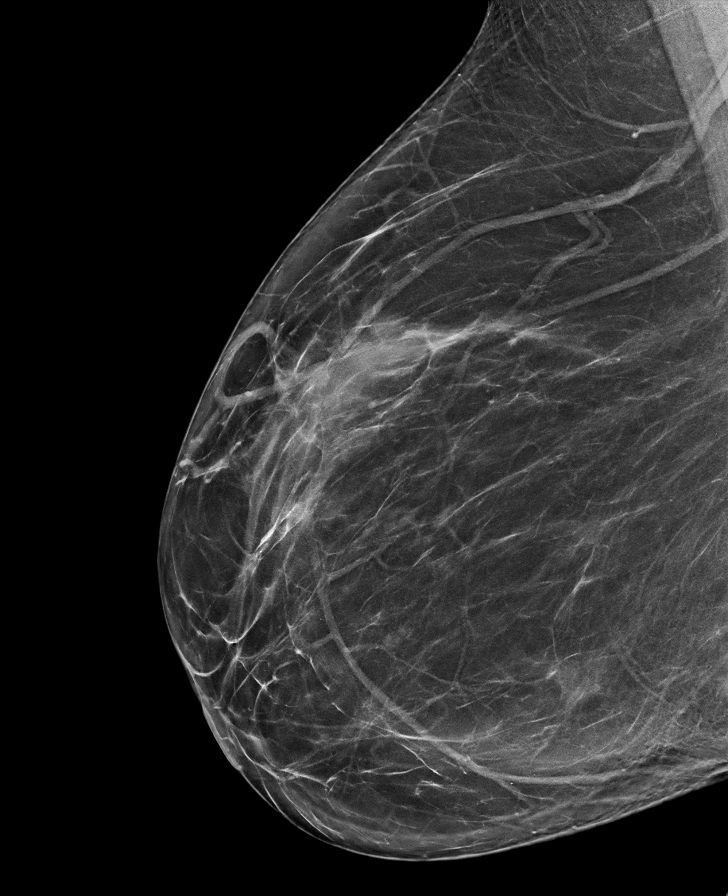

[R CC synth-2D]
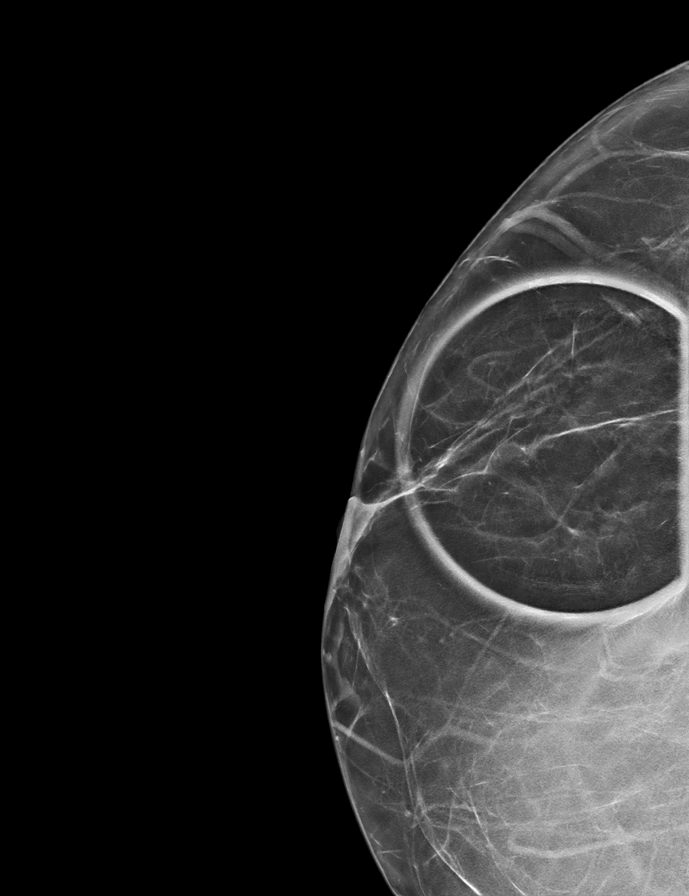

[R MLO synth-2D (1 of 2)]
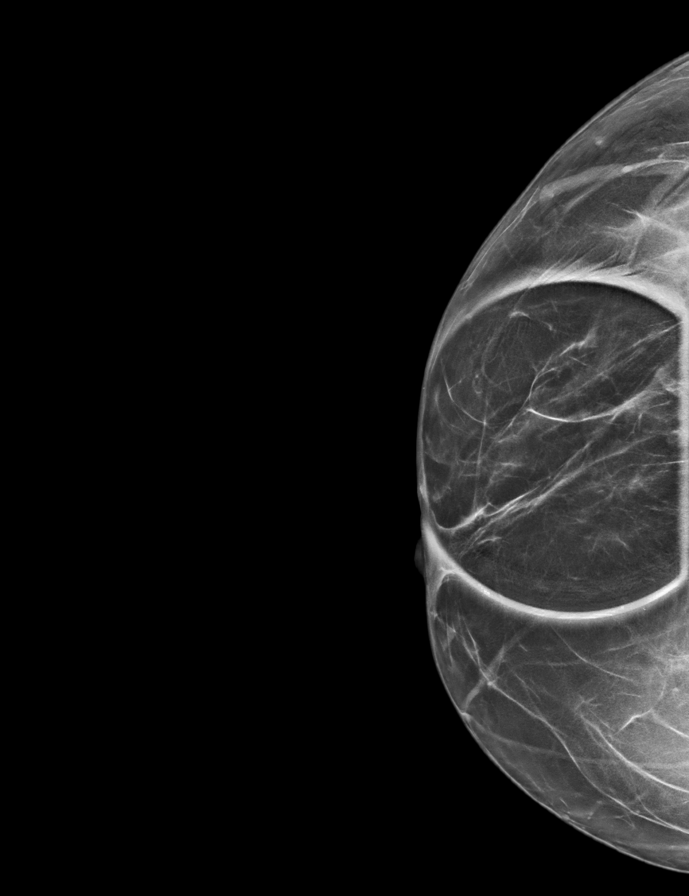

[R MLO synth-2D (2 of 2)]
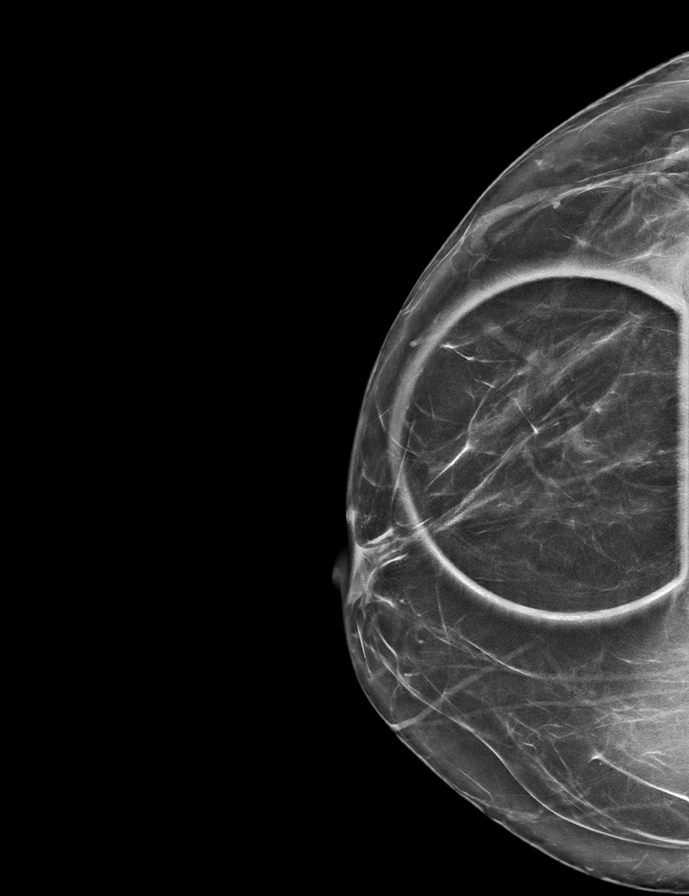

[R MLO tomo (1 of 2) · tomo slice 35/70.0]
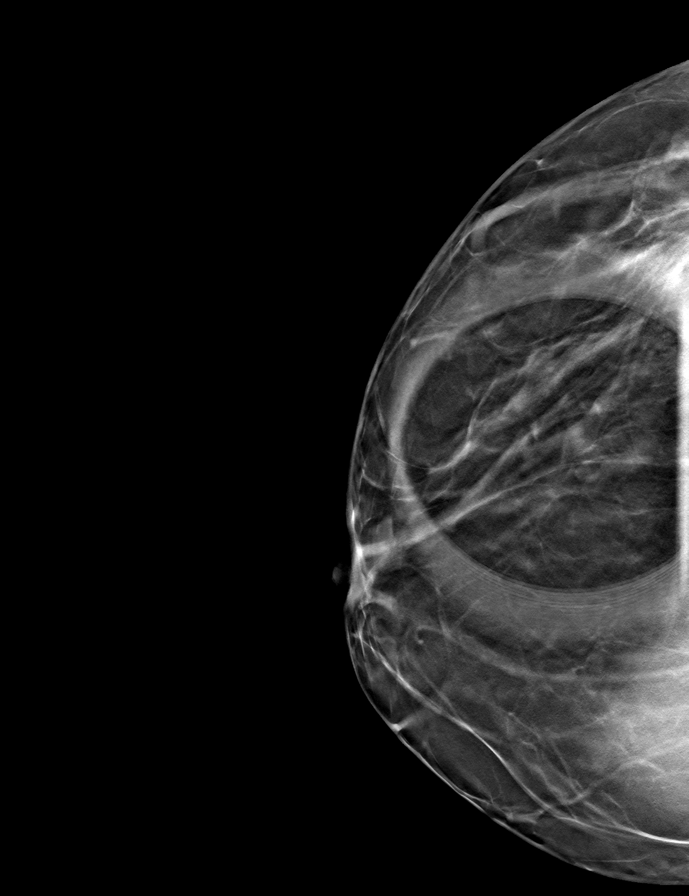

[R MLO tomo (2 of 2) · tomo slice 31/60.0]
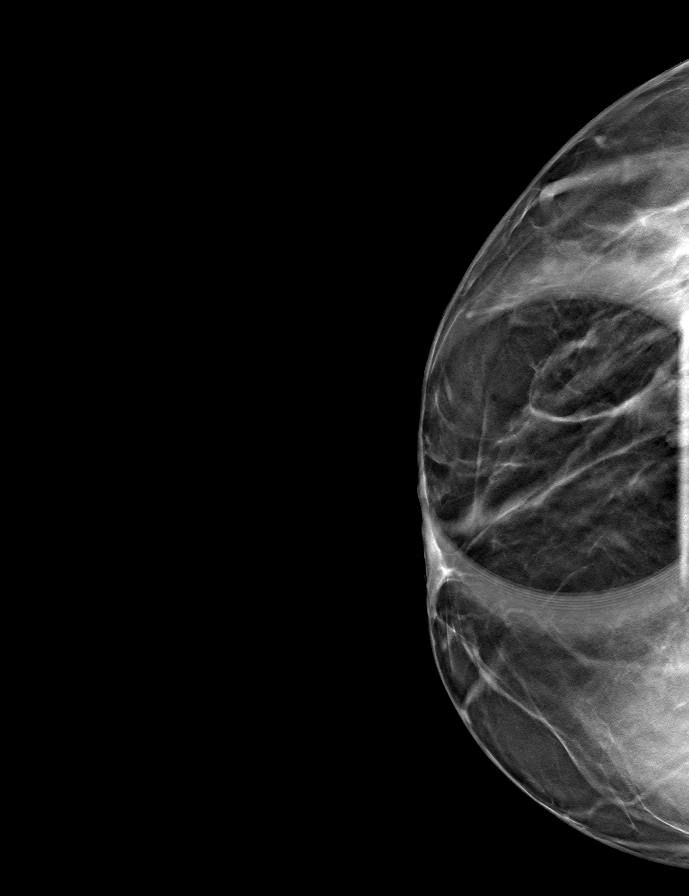

[R CC tomo · tomo slice 29/58.0]
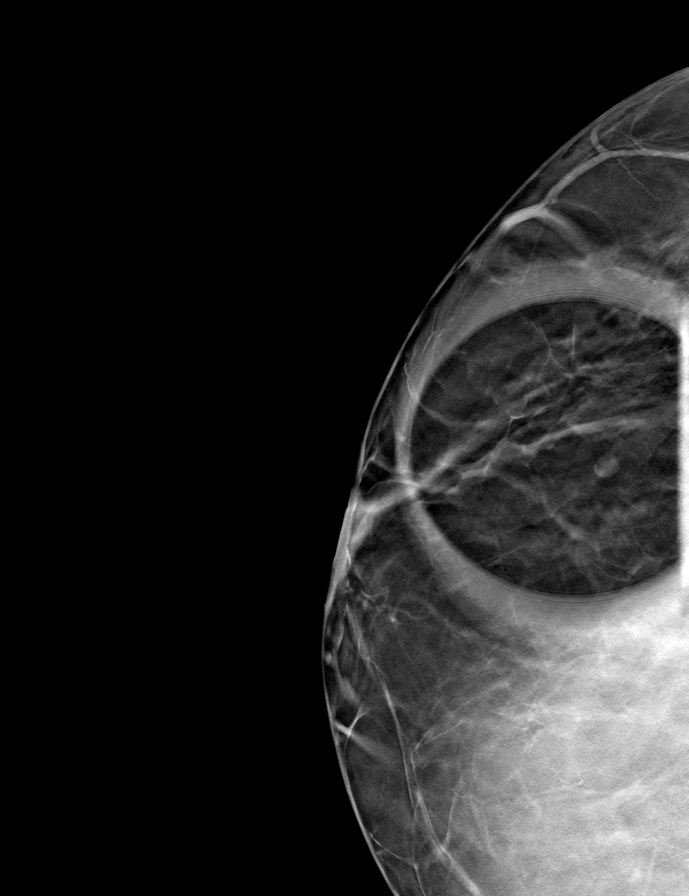

[R ML tomo · tomo slice 43/84.0]
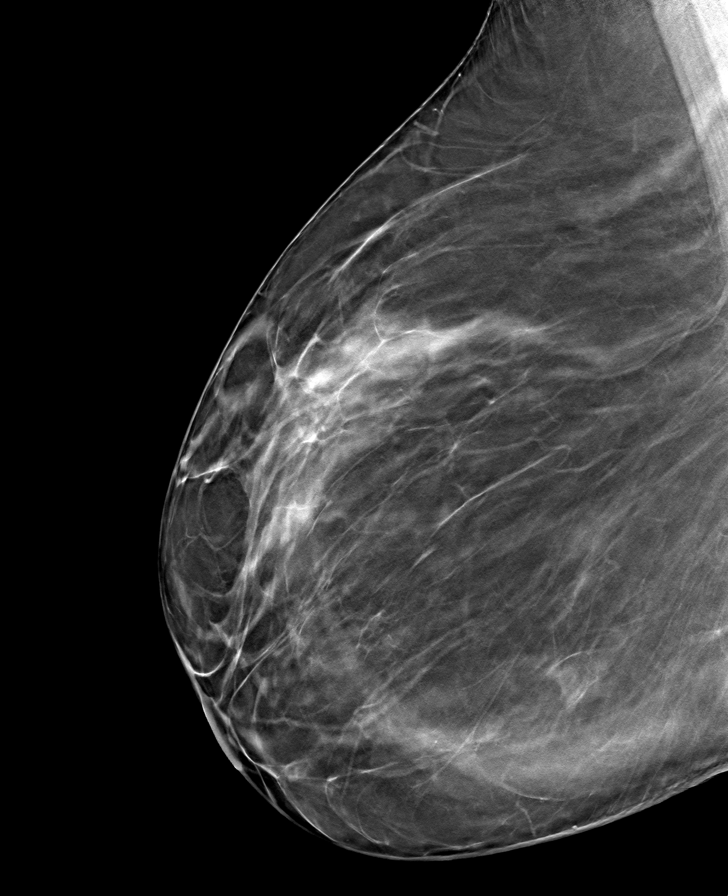

[8 of 24 positions shown; findings below may reference images not displayed]

ACR Breast Density Category b: There are scattered areas of
fibroglandular density.
FINDINGS: Additional 2-D and 3-D images are performed. These views confirm
presence of a circumscribed oval mass in the UPPER-OUTER QUADRANT of
the RIGHT breast.

Mammographic images were processed with CAD.

Targeted ultrasound is performed, showing a simple cyst in the 10
o'clock location of the RIGHT breast 2 centimeters from the nipple
which measures 0.5 x 0.2 x 0.6 centimeters. No solid masses or areas
of acoustic shadowing.
IMPRESSION: Benign cyst in the RIGHT breast accounting for the abnormality. No
mammographic or ultrasound evidence for malignancy.

RECOMMENDATION:
Screening mammogram in one year.(Code:[id number])

I have discussed the findings and recommendations with the patient.
If applicable, a reminder letter will be sent to the patient
regarding the next appointment.

BI-RADS CATEGORY  2: Benign.

## 2020-12-22 ENCOUNTER — Encounter: Payer: Self-pay | Admitting: Internal Medicine

## 2020-12-24 ENCOUNTER — Other Ambulatory Visit: Payer: Self-pay | Admitting: Internal Medicine

## 2020-12-29 ENCOUNTER — Other Ambulatory Visit: Payer: Self-pay

## 2020-12-29 ENCOUNTER — Inpatient Hospital Stay: Payer: Managed Care, Other (non HMO)

## 2020-12-29 ENCOUNTER — Encounter: Payer: Self-pay | Admitting: Internal Medicine

## 2020-12-29 ENCOUNTER — Inpatient Hospital Stay: Payer: Managed Care, Other (non HMO) | Attending: Internal Medicine | Admitting: Internal Medicine

## 2020-12-29 VITALS — BP 126/84 | HR 69 | Temp 97.3°F | Resp 20

## 2020-12-29 DIAGNOSIS — E538 Deficiency of other specified B group vitamins: Secondary | ICD-10-CM | POA: Diagnosis not present

## 2020-12-29 DIAGNOSIS — D508 Other iron deficiency anemias: Secondary | ICD-10-CM

## 2020-12-29 DIAGNOSIS — R42 Dizziness and giddiness: Secondary | ICD-10-CM | POA: Diagnosis not present

## 2020-12-29 DIAGNOSIS — R110 Nausea: Secondary | ICD-10-CM | POA: Insufficient documentation

## 2020-12-29 DIAGNOSIS — D509 Iron deficiency anemia, unspecified: Secondary | ICD-10-CM | POA: Diagnosis present

## 2020-12-29 DIAGNOSIS — E039 Hypothyroidism, unspecified: Secondary | ICD-10-CM | POA: Insufficient documentation

## 2020-12-29 DIAGNOSIS — K9589 Other complications of other bariatric procedure: Secondary | ICD-10-CM | POA: Diagnosis not present

## 2020-12-29 MED ORDER — SODIUM CHLORIDE 0.9 % IV SOLN: 200.0000 mg | Freq: Once | INTRAVENOUS | Status: DC

## 2020-12-29 MED ORDER — SODIUM CHLORIDE 0.9 % IV SOLN
Freq: Once | INTRAVENOUS | Status: AC
  Filled 2020-12-29: qty 250

## 2020-12-29 MED ORDER — IRON SUCROSE 20 MG/ML IV SOLN
200.0000 mg | Freq: Once | INTRAVENOUS | Status: AC
  Administered 2020-12-29: 200 mg via INTRAVENOUS
  Filled 2020-12-29: qty 10

## 2020-12-29 NOTE — Progress Notes (Signed)
Stable at discharge 

## 2020-12-29 NOTE — Progress Notes (Signed)
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  Patient complaining of sudden onset of dizzy spells which last for a minute or so.  Especially with movement.  Associated with intermittent nausea.  Going on for the last 1 year getting worse over the last few months.  In the interim patient has been taking sublingual B12.  Otherwise no nausea no vomiting.  No blood in stools or black stools.  Review of Systems  Constitutional: Positive for malaise/fatigue. Negative for chills, diaphoresis, fever and weight loss.  HENT: Negative for nosebleeds and sore throat.   Eyes: Negative for double vision.  Respiratory: Negative for cough, hemoptysis, sputum production, shortness of breath and wheezing.   Cardiovascular: Negative for chest pain, palpitations, orthopnea and leg swelling.  Gastrointestinal: Negative for abdominal pain, blood in stool, constipation, diarrhea, heartburn, melena, nausea and vomiting.  Genitourinary: Negative for dysuria, frequency and urgency.  Musculoskeletal: Negative for back pain and joint pain.  Neurological: Negative for dizziness, tingling, focal weakness, weakness and headaches.  Endo/Heme/Allergies: Does not bruise/bleed easily.  Psychiatric/Behavioral: Negative for depression. The patient is not nervous/anxious and does not have insomnia.      PAST MEDICAL HISTORY :  Past Medical History:  Diagnosis Date  . Allergy   . Asthma   . Depression   . Hypothyroidism   . Iron deficiency anemia   . PONV (postoperative nausea and vomiting)    HARD TIME WAKING AFTER  ENDOSCOPY  . Postprandial hypoglycemia     PAST SURGICAL HISTORY :   Past Surgical History:  Procedure Laterality Date  . CESAREAN SECTION    . CHONDROPLASTY Left 12/23/2017   Procedure: CHONDROPLASTY;  Surgeon: Signa Kell, MD;  Location: [facility] ORS;  Service: Orthopedics;  Laterality: Left;  Marland Kitchen GASTRIC BYPASS    . KNEE ARTHROSCOPY Left 12/23/2017   Procedure: ARTHROSCOPY KNEE WITH LOOSE BODY REMOVAL;  Surgeon: Signa Kell, MD;  Location: [facility] ORS;  Service: Orthopedics;  Laterality: Left;    FAMILY HISTORY :   Family History  Problem Relation Age of Onset  . Arthritis Mother   . Cervical cancer Mother   . Alcoholism Paternal Grandfather   . Stomach cancer Paternal Grandfather   . Heart disease Maternal Grandfather   . Diabetes Maternal Grandmother   . Breast cancer Neg Hx     SOCIAL HISTORY:   Social History   Tobacco Use  . Smoking status: Never Smoker  . Smokeless tobacco: Never Used  Vaping Use  . Vaping Use: Never used  Substance Use Topics  . Alcohol use: No    Alcohol/week: 0.0 standard drinks  . Drug use: No    ALLERGIES:  is allergic to morphine and related.  MEDICATIONS:  Current Outpatient Medications  Medication Sig Dispense Refill  . levothyroxine (SYNTHROID) 100 MCG tablet TAKE 1 TABLET BY MOUTH EVERY DAY 30 tablet 8   No current facility-administered medications for this visit.    PHYSICAL EXAMINATION: ECOG PERFORMANCE STATUS: 1 - Symptomatic but completely ambulatory  BP 133/90   Pulse 79   Temp 97.6 F (36.4 C) (Tympanic)   Resp 18   Wt (!) 333 lb (151 kg)  BMI 52.94 kg/m   Filed Weights   12/29/20 1300  Weight: (!) 333 lb (151 kg)    Physical Exam HENT:     Head: Normocephalic and atraumatic.     Mouth/Throat:     Pharynx: No oropharyngeal exudate.  Eyes:     Pupils: Pupils are equal, round, and reactive to light.  Cardiovascular:     Rate and Rhythm: Normal rate and regular rhythm.  Pulmonary:     Effort: No respiratory  distress.     Breath sounds: No wheezing.  Abdominal:     General: Bowel sounds are normal. There is no distension.     Palpations: Abdomen is soft. There is no mass.     Tenderness: There is no abdominal tenderness. There is no guarding or rebound.  Musculoskeletal:        General: No tenderness. Normal range of motion.     Cervical back: Normal range of motion and neck supple.  Skin:    General: Skin is warm.  Neurological:     Mental Status: She is alert and oriented to person, place, and time.  Psychiatric:        Mood and Affect: Affect normal.      LABORATORY DATA:  I have reviewed the data as listed    Component Value Date/Time   NA 138 12/21/2019 1625   NA 137 04/25/2014 1534   K 4.3 12/21/2019 1625   K 4.7 04/25/2014 1534   CL 102 12/21/2019 1625   CL 106 04/25/2014 1534   CO2 23 12/21/2019 1625   CO2 22 04/25/2014 1534   GLUCOSE 75 12/21/2019 1625   GLUCOSE 90 04/25/2014 1534   BUN 10 12/21/2019 1625   BUN 11 04/25/2014 1534   CREATININE 0.57 12/21/2019 1625   CREATININE 0.67 04/25/2014 1534   CALCIUM 8.6 (L) 12/21/2019 1625   CALCIUM 8.1 (L) 04/25/2014 1534   PROT 6.7 12/21/2019 1625   ALBUMIN 3.9 12/21/2019 1625   AST 15 12/21/2019 1625   ALT 9 12/21/2019 1625   ALKPHOS 69 12/21/2019 1625   BILITOT 0.4 12/21/2019 1625   GFRNONAA 115 12/21/2019 1625   GFRNONAA >60 04/25/2014 1534   GFRAA 132 12/21/2019 1625   GFRAA >60 04/25/2014 1534    No results found for: SPEP, UPEP  Lab Results  Component Value Date   WBC 4.6 11/11/2017   NEUTROABS 2.8 11/11/2017   HGB 13.1 11/11/2017   HCT 38.8 11/11/2017   MCV 90 11/11/2017   PLT 262 11/11/2017      Chemistry      Component Value Date/Time   NA 138 12/21/2019 1625   NA 137 04/25/2014 1534   K 4.3 12/21/2019 1625   K 4.7 04/25/2014 1534   CL 102 12/21/2019 1625   CL 106 04/25/2014 1534   CO2 23 12/21/2019 1625   CO2 22 04/25/2014 1534   BUN 10 12/21/2019 1625   BUN 11 04/25/2014 1534    CREATININE 0.57 12/21/2019 1625   CREATININE 0.67 04/25/2014 1534   GLU 129 07/25/2019 0000      Component Value Date/Time   CALCIUM 8.6 (L) 12/21/2019 1625   CALCIUM 8.1 (L) 04/25/2014 1534   ALKPHOS 69 12/21/2019 1625   AST 15 12/21/2019 1625   ALT 9 12/21/2019 1625   BILITOT 0.4 12/21/2019 1625         ASSESSMENT & PLAN:   Iron deficiency anemia following bariatric surgery # 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   # vertigo- ? Defer to ENT/evaluation   # DISPOSITION:  # venofer today # follow up in 6 months- MD [[facility]/ferritin/iron studies/b12]- possible venofer-Dr.B      Earna Coder, MD 12/29/2020 1:22 PM

## 2020-12-29 NOTE — Assessment & Plan Note (Signed)
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   # vertigo- ? Defer to ENT/evaluation   # DISPOSITION:  # venofer today # follow up in 6 months- MD [[facility]/ferritin/iron studies/b12]- possible venofer-Dr.B

## 2021-02-19 ENCOUNTER — Telehealth: Payer: Self-pay | Admitting: Family Medicine

## 2021-02-19 NOTE — Telephone Encounter (Signed)
Given I see wife.. I will accept the patient, but it may be a while before pt can be seen... add on next new pt slot, not on same day as a transfer of care appt.

## 2021-02-19 NOTE — Telephone Encounter (Signed)
Pt husband called and wanted to know about getting in our practice, I did explain we may not but wanted to check due to his wife sees Dr. Ermalene Searing

## 2021-06-17 ENCOUNTER — Encounter: Payer: Self-pay | Admitting: Internal Medicine

## 2021-06-19 ENCOUNTER — Other Ambulatory Visit: Payer: Self-pay | Admitting: Internal Medicine

## 2021-06-19 ENCOUNTER — Telehealth: Payer: Self-pay | Admitting: Internal Medicine

## 2021-06-19 NOTE — Telephone Encounter (Signed)
Collete-  please make sure patient scheduled for Venofer at the next visit GB

## 2021-06-22 NOTE — Telephone Encounter (Signed)
Kelly Barr/Kelly Barr - will you add pt to schedule on 7/25 for venofer.

## 2021-06-29 ENCOUNTER — Inpatient Hospital Stay: Payer: Managed Care, Other (non HMO) | Attending: Internal Medicine | Admitting: Internal Medicine

## 2021-06-29 ENCOUNTER — Other Ambulatory Visit: Payer: Self-pay

## 2021-06-29 ENCOUNTER — Telehealth: Payer: Self-pay | Admitting: Internal Medicine

## 2021-06-29 ENCOUNTER — Inpatient Hospital Stay: Payer: Managed Care, Other (non HMO)

## 2021-06-29 ENCOUNTER — Encounter: Payer: Self-pay | Admitting: Internal Medicine

## 2021-06-29 VITALS — BP 107/71 | HR 62 | Resp 16

## 2021-06-29 DIAGNOSIS — K9589 Other complications of other bariatric procedure: Secondary | ICD-10-CM

## 2021-06-29 DIAGNOSIS — E039 Hypothyroidism, unspecified: Secondary | ICD-10-CM | POA: Diagnosis not present

## 2021-06-29 DIAGNOSIS — D508 Other iron deficiency anemias: Secondary | ICD-10-CM | POA: Diagnosis not present

## 2021-06-29 DIAGNOSIS — D509 Iron deficiency anemia, unspecified: Secondary | ICD-10-CM | POA: Insufficient documentation

## 2021-06-29 DIAGNOSIS — E038 Other specified hypothyroidism: Secondary | ICD-10-CM

## 2021-06-29 MED ORDER — SODIUM CHLORIDE 0.9 % IV SOLN: 200.0000 mg | Freq: Once | INTRAVENOUS | Status: DC

## 2021-06-29 MED ORDER — IRON SUCROSE 20 MG/ML IV SOLN
200.0000 mg | Freq: Once | INTRAVENOUS | Status: AC
  Administered 2021-06-29: 200 mg via INTRAVENOUS
  Filled 2021-06-29: qty 10

## 2021-06-29 MED ORDER — SODIUM CHLORIDE 0.9 % IV SOLN
Freq: Once | INTRAVENOUS | Status: AC
  Filled 2021-06-29: qty 250

## 2021-06-29 NOTE — Assessment & Plan Note (Addendum)
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   # Fatigue/ weight gain [??60 pounds in 12 months] ? Sec OSA/sleep study;defer to PCP re: obesity clinic. Recommend follow up with PCP re: thyroid [2021- TSH ~5; Low T4]- can cause weight gain.   # B12 def- Sublingual B12/ vit D- STABLE;  # DISPOSITION:  # venofer today # follow up in 6 months- MD [[facility]/ferritin/iron studies/b12]- possible venofer-Dr.B

## 2021-06-29 NOTE — Progress Notes (Signed)
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  Patient denies any blood in stools or black or stools.  Complains of worsening fatigue.  Positive for weight gain.   Review of Systems  Constitutional:  Positive for malaise/fatigue. Negative for chills, diaphoresis, fever and weight loss.  HENT:  Negative for nosebleeds and sore throat.   Eyes:  Negative for double vision.  Respiratory:  Negative for cough, hemoptysis, sputum production, shortness of breath and wheezing.   Cardiovascular:  Negative for chest pain, palpitations, orthopnea and leg swelling.  Gastrointestinal:  Negative for abdominal pain, blood in stool, constipation, diarrhea, heartburn, melena, nausea and vomiting.  Genitourinary:  Negative for dysuria, frequency and urgency.  Musculoskeletal:  Negative for back pain and joint pain.  Neurological:  Negative for dizziness, tingling, focal weakness, weakness and headaches.  Endo/Heme/Allergies:  Does not bruise/bleed easily.  Psychiatric/Behavioral:  Negative for depression. The patient is not nervous/anxious and does not have insomnia.     PAST MEDICAL HISTORY :  Past Medical History:  Diagnosis Date   Allergy    Asthma    Depression    Hypothyroidism    Iron deficiency anemia    PONV (postoperative nausea and vomiting)    HARD TIME WAKING AFTER ENDOSCOPY   Postprandial hypoglycemia     PAST SURGICAL HISTORY :   Past Surgical History:  Procedure Laterality Date   CESAREAN SECTION     CHONDROPLASTY Left 12/23/2017   Procedure: CHONDROPLASTY;  Surgeon: Signa Kell, MD;   Location: [facility] OR[facility];  Service: Orthopedics;  Laterality: Left;   GASTRIC BYPASS     KNEE ARTHROSCOPY Left 12/23/2017   Procedure: ARTHROSCOPY KNEE WITH LOOSE BODY REMOVAL;  Surgeon: Signa Kell, MD;  Location: [facility] OR[facility];  Service: Orthopedics;  Laterality: Left;    FAMILY HISTORY :   Family History  Problem Relation Age of Onset   Arthritis Mother    Cervical cancer Mother    Alcoholism Paternal Grandfather    Stomach cancer Paternal Grandfather    Heart disease Maternal Grandfather    Diabetes Maternal Grandmother    Breast cancer Neg Hx     SOCIAL HISTORY:   Social History   Tobacco Use   Smoking status: Never   Smokeless tobacco: Never  Vaping Use   Vaping Use: Never used  Substance Use Topics   Alcohol use: No    Alcohol/week: 0.0 standard drinks   Drug use: No    ALLERGIES:  is allergic to morphine and related.  MEDICATIONS:  Current Outpatient Medications  Medication Sig Dispense Refill   levothyroxine (SYNTHROID) 100 MCG tablet TAKE 1 TABLET BY MOUTH EVERY DAY (Patient not taking: Reported on 06/29/2021) 30 tablet 8   No current facility-administered medications for this visit.    PHYSICAL EXAMINATION: ECOG PERFORMANCE STATUS: 1 - Symptomatic but completely ambulatory  BP 134/81 (BP Location: Left Arm, Patient Position: Sitting, Cuff Size: Large)   Pulse 90   Temp 99.6 F (37.6 C) (Tympanic)   Resp 18   Ht 5' 6.5" (1.689 m)   Wt (!) 341 lb (154.7 kg)   SpO2 100%   BMI 54.21 kg/m  Filed Weights   06/29/21 1304  Weight: (!) 341 lb (154.7 kg)    Physical Exam HENT:     Head: Normocephalic and atraumatic.     Mouth/Throat:     Pharynx: No oropharyngeal exudate.  Eyes:     Pupils: Pupils are equal, round, and reactive to light.  Cardiovascular:     Rate and Rhythm: Normal rate and regular rhythm.  Pulmonary:     Effort: No respiratory distress.     Breath sounds: No wheezing.  Abdominal:     General: Bowel sounds are normal. There is no  distension.     Palpations: Abdomen is soft. There is no mass.     Tenderness: There is no abdominal tenderness. There is no guarding or rebound.  Musculoskeletal:        General: No tenderness. Normal range of motion.     Cervical back: Normal range of motion and neck supple.  Skin:    General: Skin is warm.  Neurological:     Mental Status: She is alert and oriented to person, place, and time.  Psychiatric:        Mood and Affect: Affect normal.     LABORATORY DATA:  I have reviewed the data as listed    Component Value Date/Time   NA 138 12/21/2019 1625   NA 137 04/25/2014 1534   K 4.3 12/21/2019 1625   K 4.7 04/25/2014 1534   CL 102 12/21/2019 1625   CL 106 04/25/2014 1534   CO2 23 12/21/2019 1625   CO2 22 04/25/2014 1534   GLUCOSE 75 12/21/2019 1625   GLUCOSE 90 04/25/2014 1534   BUN 10 12/21/2019 1625   BUN 11 04/25/2014 1534   CREATININE 0.57 12/21/2019 1625   CREATININE 0.67 04/25/2014 1534   CALCIUM 8.6 (L) 12/21/2019 1625   CALCIUM 8.1 (L) 04/25/2014 1534   PROT 6.7 12/21/2019 1625   ALBUMIN 3.9 12/21/2019 1625   AST 15 12/21/2019 1625   ALT 9 12/21/2019 1625   ALKPHOS 69 12/21/2019 1625   BILITOT 0.4 12/21/2019 1625   GFRNONAA 115 12/21/2019 1625   GFRNONAA >60 04/25/2014 1534   GFRAA 132 12/21/2019 1625   GFRAA >60 04/25/2014 1534    No results found for: SPEP, UPEP  Lab Results  Component Value Date   WBC 4.6 11/11/2017   NEUTROABS 2.8 11/11/2017   HGB 13.1 11/11/2017   HCT 38.8 11/11/2017   MCV 90 11/11/2017   PLT 262 11/11/2017      Chemistry      Component Value Date/Time   NA 138 12/21/2019 1625   NA 137 04/25/2014 1534   K 4.3 12/21/2019 1625   K 4.7 04/25/2014 1534   CL 102 12/21/2019 1625   CL 106 04/25/2014 1534   CO2 23 12/21/2019 1625   CO2 22 04/25/2014 1534   BUN 10 12/21/2019 1625   BUN 11 04/25/2014 1534   CREATININE 0.57 12/21/2019 1625   CREATININE 0.67 04/25/2014 1534   GLU 129 07/25/2019 0000      Component  Value Date/Time   CALCIUM 8.6 (L) 12/21/2019 1625   CALCIUM 8.1 (L) 04/25/2014 1534   ALKPHOS 69 12/21/2019 1625   AST 15 12/21/2019 1625   ALT 9 12/21/2019 1625   BILITOT 0.4 12/21/2019 1625         ASSESSMENT & PLAN:   Iron deficiency anemia following bariatric surgery # 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   # Fatigue: ? Sec OSA/sleep study; weight gain-defer to PCP re: obesity clinic. Recommend follow up with PCP re: thyroid [2021- TSH ~5; Low T4]- can cause weight gain.   # B12 def- Sublingual B12/ vit D- STABLE;  # DISPOSITION:  # venofer today # follow up in 6 months- MD [[facility]/ferritin/iron studies/b12]- possible venofer-Dr.B    Earna Coder, MD 06/29/2021 1:56 PM

## 2021-06-29 NOTE — Telephone Encounter (Signed)
On 7/25-unable to reach the patient ; I left a detailed message for the patient discussing these abnormal thyroid function in  2021.    Recommend follow-up with PCP regarding rechecking thyroid profile-which could explain her significant weight gain.  FYI-Dr.Bedsole.

## 2021-06-30 ENCOUNTER — Other Ambulatory Visit: Payer: Self-pay

## 2021-06-30 ENCOUNTER — Other Ambulatory Visit: Payer: Self-pay | Admitting: Family Medicine

## 2021-06-30 ENCOUNTER — Other Ambulatory Visit: Payer: Managed Care, Other (non HMO)

## 2021-06-30 ENCOUNTER — Other Ambulatory Visit (INDEPENDENT_AMBULATORY_CARE_PROVIDER_SITE_OTHER): Payer: Managed Care, Other (non HMO)

## 2021-06-30 DIAGNOSIS — E039 Hypothyroidism, unspecified: Secondary | ICD-10-CM

## 2021-06-30 DIAGNOSIS — E038 Other specified hypothyroidism: Secondary | ICD-10-CM

## 2021-06-30 NOTE — Progress Notes (Signed)
Opened in error

## 2021-06-30 NOTE — Addendum Note (Signed)
Addended by: Damita Lack on: 06/30/2021 10:49 AM   Modules accepted: Orders

## 2021-06-30 NOTE — Telephone Encounter (Signed)
Call   Set up pt with lab only appt to check tsh, free t3 and free t4 if no upcoming labs already scheduled.

## 2021-06-30 NOTE — Telephone Encounter (Signed)
Shresta notified as instructed by telephone.  Lab appointment scheduled today at 4:00 pm.  Future orders placed in Epic.

## 2021-07-01 LAB — T3, FREE: T3, Free: 2.1 pg/mL (ref 2.0–4.4)

## 2021-07-01 LAB — T4, FREE: Free T4: 0.71 ng/dL — ABNORMAL LOW (ref 0.82–1.77)

## 2021-07-01 LAB — TSH: TSH: 5.57 u[IU]/mL — ABNORMAL HIGH (ref 0.450–4.500)

## 2021-07-02 MED ORDER — LEVOTHYROXINE SODIUM 100 MCG PO TABS: 100.0000 ug | ORAL_TABLET | Freq: Every day | ORAL | 8 refills | Status: DC

## 2021-11-10 NOTE — Telephone Encounter (Signed)
10/05/2019- unable to sound uterus, will obtain TVUS to evaluate for fibroid or other pathology that may be obstructing the lower uterine segment.  Will plan on cytotec vaginal prior to procedure and potential use of ultrasound guidance. - Given >40 with Dicussed obtaining endometrial biopsy at time of IUD placement to rule out hyperplasia.

## 2021-12-15 ENCOUNTER — Encounter: Payer: Self-pay | Admitting: Internal Medicine

## 2021-12-16 ENCOUNTER — Encounter: Payer: Self-pay | Admitting: Family Medicine

## 2021-12-16 ENCOUNTER — Telehealth: Payer: Self-pay

## 2021-12-16 NOTE — Telephone Encounter (Signed)
Notified patient that labs have been forwarded to PCP as well.

## 2021-12-16 NOTE — Telephone Encounter (Signed)
Dr. Donneta Romberg follows patient for anemia.  Recent lab results showed a glucose level of 61.  Results from 12/10/2021 that are scanned in have been faxed and forwarded to PCP Dr. Ermalene Searing for her to review.

## 2021-12-28 ENCOUNTER — Other Ambulatory Visit: Payer: Self-pay

## 2021-12-28 ENCOUNTER — Encounter: Payer: Self-pay | Admitting: Internal Medicine

## 2021-12-28 ENCOUNTER — Inpatient Hospital Stay: Payer: Managed Care, Other (non HMO)

## 2021-12-28 ENCOUNTER — Inpatient Hospital Stay: Payer: Managed Care, Other (non HMO) | Attending: Internal Medicine | Admitting: Internal Medicine

## 2021-12-28 VITALS — BP 120/77 | HR 65 | Temp 98.0°F | Resp 18

## 2021-12-28 DIAGNOSIS — D508 Other iron deficiency anemias: Secondary | ICD-10-CM

## 2021-12-28 DIAGNOSIS — E161 Other hypoglycemia: Secondary | ICD-10-CM | POA: Diagnosis not present

## 2021-12-28 DIAGNOSIS — D509 Iron deficiency anemia, unspecified: Secondary | ICD-10-CM | POA: Insufficient documentation

## 2021-12-28 DIAGNOSIS — K9589 Other complications of other bariatric procedure: Secondary | ICD-10-CM

## 2021-12-28 DIAGNOSIS — E039 Hypothyroidism, unspecified: Secondary | ICD-10-CM | POA: Diagnosis not present

## 2021-12-28 MED ORDER — SODIUM CHLORIDE 0.9 % IV SOLN
Freq: Once | INTRAVENOUS | Status: AC
  Filled 2021-12-28: qty 250

## 2021-12-28 MED ORDER — IRON SUCROSE 20 MG/ML IV SOLN
200.0000 mg | Freq: Once | INTRAVENOUS | Status: AC
  Administered 2021-12-28: 200 mg via INTRAVENOUS
  Filled 2021-12-28: qty 10

## 2021-12-28 MED ORDER — SODIUM CHLORIDE 0.9 % IV SOLN: 200.0000 mg | Freq: Once | INTRAVENOUS | Status: DC

## 2021-12-28 NOTE — Progress Notes (Signed)
[facility] OFFICE PROGRESS NOTE  Patient Care Team: Jinny Sanders, MD as PCP - General   SUMMARY OF ONCOLOGIC HISTORY:  # 2006- IRON DEFICIENCY ANEMIA sec Malabsorption/ gastric bypass, Roux-en-Y; IV Ferrahem every 6 months-maintenance; sublingual B12  #Hypothyroidism  INTERVAL HISTORY:  46 year old female patient with a history of gastric bypass/malabsorption is currently IV Iron every 6 months is here for follow-up.  Patient denies any blood in stools or black or stools.  Denies any nausea vomiting abdominal pain.  Does admit to abdominal discomfort/diaphoresis post lunch especially carbohydrate rich diet  Review of Systems  Constitutional:  Positive for malaise/fatigue. Negative for chills, diaphoresis, fever and weight loss.  HENT:  Negative for nosebleeds and sore throat.   Eyes:  Negative for double vision.  Respiratory:  Negative for cough, hemoptysis, sputum production, shortness of breath and wheezing.   Cardiovascular:  Negative for chest pain, palpitations, orthopnea and leg swelling.  Gastrointestinal:  Negative for abdominal pain, blood in stool, constipation, diarrhea, heartburn, melena, nausea and vomiting.  Genitourinary:  Negative for dysuria, frequency and urgency.  Musculoskeletal:  Negative for back pain and joint pain.  Neurological:  Negative for dizziness, tingling, focal weakness, weakness and headaches.  Endo/Heme/Allergies:  Does not bruise/bleed easily.  Psychiatric/Behavioral:  Negative for depression. The patient is not nervous/anxious and does not have insomnia.     PAST MEDICAL HISTORY :  Past Medical History:  Diagnosis Date   Allergy    Asthma    Depression    Hypothyroidism    Iron deficiency anemia    PONV (postoperative nausea and vomiting)    HARD TIME WAKING AFTER ENDOSCOPY   Postprandial hypoglycemia     PAST SURGICAL HISTORY :   Past Surgical History:  Procedure Laterality Date   CESAREAN SECTION      CHONDROPLASTY Left 12/23/2017   Procedure: CHONDROPLASTY;  Surgeon: Leim Fabry, MD;  Location: [facility];  Service: Orthopedics;  Laterality: Left;   GASTRIC BYPASS     KNEE ARTHROSCOPY Left 12/23/2017   Procedure: ARTHROSCOPY KNEE WITH LOOSE BODY REMOVAL;  Surgeon: Leim Fabry, MD;  Location: [facility];  Service: Orthopedics;  Laterality: Left;    FAMILY HISTORY :   Family History  Problem Relation Age of Onset   Arthritis Mother    Cervical cancer Mother    Alcoholism Paternal Grandfather    Stomach cancer Paternal Grandfather    Heart disease Maternal Grandfather    Diabetes Maternal Grandmother    Breast cancer Neg Hx     SOCIAL HISTORY:   Social History   Tobacco Use   Smoking status: Never   Smokeless tobacco: Never  Vaping Use   Vaping Use: Never used  Substance Use Topics   Alcohol use: No    Alcohol/week: 0.0 standard drinks   Drug use: No    ALLERGIES:  is allergic to morphine and related.  MEDICATIONS:  Current Outpatient Medications  Medication Sig Dispense Refill   Levothyroxine Sodium 100 MCG CAPS levothyroxine 100 mcg capsule     meloxicam (MOBIC) 7.5 MG tablet Take 7.5 mg by mouth 2 (two) times daily.     No current facility-administered medications for this visit.    PHYSICAL EXAMINATION: ECOG PERFORMANCE STATUS: 1 - Symptomatic but completely ambulatory  BP 138/90    Pulse 76    Temp 98.4 F (36.9 C)    Resp 20    Wt (!) 342 lb 3.2 oz (155.2 kg)    BMI 54.41 kg/m  Filed Weights   12/28/21 1320  Weight: (!) 342 lb 3.2 oz (155.2 kg)    Physical Exam HENT:     Head: Normocephalic and atraumatic.     Mouth/Throat:     Pharynx: No oropharyngeal exudate.  Eyes:     Pupils: Pupils are equal, round, and reactive to light.  Cardiovascular:     Rate and Rhythm: Normal rate and regular rhythm.  Pulmonary:     Effort: No respiratory distress.     Breath sounds: No wheezing.  Abdominal:     General: Bowel sounds are normal. There is no  distension.     Palpations: Abdomen is soft. There is no mass.     Tenderness: There is no abdominal tenderness. There is no guarding or rebound.  Musculoskeletal:        General: No tenderness. Normal range of motion.     Cervical back: Normal range of motion and neck supple.  Skin:    General: Skin is warm.  Neurological:     Mental Status: She is alert and oriented to person, place, and time.  Psychiatric:        Mood and Affect: Affect normal.     LABORATORY DATA:  I have reviewed the data as listed    Component Value Date/Time   NA 138 12/21/2019 1625   NA 137 04/25/2014 1534   K 4.3 12/21/2019 1625   K 4.7 04/25/2014 1534   CL 102 12/21/2019 1625   CL 106 04/25/2014 1534   CO2 23 12/21/2019 1625   CO2 22 04/25/2014 1534   GLUCOSE 75 12/21/2019 1625   GLUCOSE 90 04/25/2014 1534   BUN 10 12/21/2019 1625   BUN 11 04/25/2014 1534   CREATININE 0.57 12/21/2019 1625   CREATININE 0.67 04/25/2014 1534   CALCIUM 8.6 (L) 12/21/2019 1625   CALCIUM 8.1 (L) 04/25/2014 1534   PROT 6.7 12/21/2019 1625   ALBUMIN 3.9 12/21/2019 1625   AST 15 12/21/2019 1625   ALT 9 12/21/2019 1625   ALKPHOS 69 12/21/2019 1625   BILITOT 0.4 12/21/2019 1625   GFRNONAA 115 12/21/2019 1625   GFRNONAA >60 04/25/2014 1534   GFRAA 132 12/21/2019 1625   GFRAA >60 04/25/2014 1534    No results found for: SPEP, UPEP  Lab Results  Component Value Date   WBC 4.6 11/11/2017   NEUTROABS 2.8 11/11/2017   HGB 13.1 11/11/2017   HCT 38.8 11/11/2017   MCV 90 11/11/2017   PLT 262 11/11/2017      Chemistry      Component Value Date/Time   NA 138 12/21/2019 1625   NA 137 04/25/2014 1534   K 4.3 12/21/2019 1625   K 4.7 04/25/2014 1534   CL 102 12/21/2019 1625   CL 106 04/25/2014 1534   CO2 23 12/21/2019 1625   CO2 22 04/25/2014 1534   BUN 10 12/21/2019 1625   BUN 11 04/25/2014 1534   CREATININE 0.57 12/21/2019 1625   CREATININE 0.67 04/25/2014 1534   GLU 129 07/25/2019 0000      Component  Value Date/Time   CALCIUM 8.6 (L) 12/21/2019 1625   CALCIUM 8.1 (L) 04/25/2014 1534   ALKPHOS 69 12/21/2019 1625   AST 15 12/21/2019 1625   ALT 9 12/21/2019 1625   BILITOT 0.4 12/21/2019 1625         ASSESSMENT & PLAN:   Iron deficiency anemia following bariatric surgery # Iron Deficiency 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 #As patient is symptomatic proceed with IV venofer; also recommend 2 more infusions.  # Hypoglycemia- 61 after Lunch- ? Gastric dumping syndrome- recommend evaluation with Obesity medicine doctor- defer to PCP.   # B12 def- Sublingual B12/ vit D- STABLE;  # DISPOSITION:  # venofer today # venofer weekly x2 # follow up in 6 months- MD [[facility]/ferritin/iron studies/b12]- possible venofer-Dr.B     Cammie Sickle, MD 12/28/2021 1:59 PM

## 2021-12-28 NOTE — Patient Instructions (Signed)

## 2021-12-28 NOTE — Assessment & Plan Note (Addendum)
#   Iron Deficiency secondary to malabsorption from gastric bypass-symptomatic with fatigue.; JAN 2023 [[facility]] Hemoglobin today 12.5; however iron saturation 4%; Ferritin 6. #As patient is symptomatic proceed with IV venofer; also recommend 2 more infusions.  # Hypoglycemia- 61 after Lunch- ? Gastric dumping syndrome- recommend evaluation with Obesity medicine doctor- defer to PCP.   # B12 def- Sublingual B12/ vit D- STABLE;  # DISPOSITION:  # venofer today # venofer weekly x2 # follow up in 6 months- MD [[facility]/ferritin/iron studies/b12]- possible venofer-Dr.B

## 2021-12-28 NOTE — Progress Notes (Signed)
Patient feels "exhausted".

## 2022-01-01 ENCOUNTER — Telehealth: Payer: Self-pay | Admitting: Internal Medicine

## 2022-01-01 NOTE — Telephone Encounter (Signed)
Pt called to reschedule her appt for 1-30. She is not feeling well. Call back at [phone]

## 2022-01-04 ENCOUNTER — Inpatient Hospital Stay: Payer: Managed Care, Other (non HMO)

## 2022-01-05 ENCOUNTER — Ambulatory Visit: Payer: Managed Care, Other (non HMO) | Admitting: Licensed Practical Nurse

## 2022-01-11 ENCOUNTER — Other Ambulatory Visit: Payer: Self-pay

## 2022-01-11 ENCOUNTER — Inpatient Hospital Stay: Payer: Managed Care, Other (non HMO) | Attending: Internal Medicine

## 2022-01-11 VITALS — BP 121/61 | HR 78

## 2022-01-11 DIAGNOSIS — D508 Other iron deficiency anemias: Secondary | ICD-10-CM

## 2022-01-11 DIAGNOSIS — D509 Iron deficiency anemia, unspecified: Secondary | ICD-10-CM | POA: Diagnosis not present

## 2022-01-11 DIAGNOSIS — K9589 Other complications of other bariatric procedure: Secondary | ICD-10-CM

## 2022-01-11 MED ORDER — IRON SUCROSE 20 MG/ML IV SOLN
200.0000 mg | Freq: Once | INTRAVENOUS | Status: AC
  Administered 2022-01-11: 200 mg via INTRAVENOUS
  Filled 2022-01-11: qty 10

## 2022-01-11 MED ORDER — SODIUM CHLORIDE 0.9 % IV SOLN: 200.0000 mg | Freq: Once | INTRAVENOUS | Status: DC

## 2022-01-11 MED ORDER — SODIUM CHLORIDE 0.9 % IV SOLN
Freq: Once | INTRAVENOUS | Status: AC
  Filled 2022-01-11: qty 250

## 2022-01-18 ENCOUNTER — Inpatient Hospital Stay: Payer: Managed Care, Other (non HMO)

## 2022-01-18 ENCOUNTER — Other Ambulatory Visit: Payer: Self-pay

## 2022-01-18 VITALS — BP 106/60 | HR 72 | Temp 97.8°F | Resp 19

## 2022-01-18 DIAGNOSIS — K9589 Other complications of other bariatric procedure: Secondary | ICD-10-CM

## 2022-01-18 DIAGNOSIS — D508 Other iron deficiency anemias: Secondary | ICD-10-CM

## 2022-01-18 DIAGNOSIS — D509 Iron deficiency anemia, unspecified: Secondary | ICD-10-CM | POA: Diagnosis not present

## 2022-01-18 MED ORDER — SODIUM CHLORIDE 0.9 % IV SOLN
Freq: Once | INTRAVENOUS | Status: AC
  Filled 2022-01-18: qty 250

## 2022-01-18 MED ORDER — SODIUM CHLORIDE 0.9 % IV SOLN: 200.0000 mg | Freq: Once | INTRAVENOUS | Status: DC

## 2022-01-18 MED ORDER — IRON SUCROSE 20 MG/ML IV SOLN
200.0000 mg | Freq: Once | INTRAVENOUS | Status: AC
  Administered 2022-01-18: 200 mg via INTRAVENOUS
  Filled 2022-01-18: qty 10

## 2022-01-19 ENCOUNTER — Encounter: Payer: Self-pay | Admitting: Licensed Practical Nurse

## 2022-01-19 ENCOUNTER — Ambulatory Visit (INDEPENDENT_AMBULATORY_CARE_PROVIDER_SITE_OTHER): Payer: Managed Care, Other (non HMO) | Admitting: Licensed Practical Nurse

## 2022-01-19 VITALS — BP 122/80 | Ht 67.0 in | Wt 347.0 lb

## 2022-01-19 DIAGNOSIS — Z01419 Encounter for gynecological examination (general) (routine) without abnormal findings: Secondary | ICD-10-CM | POA: Diagnosis not present

## 2022-01-19 DIAGNOSIS — N926 Irregular menstruation, unspecified: Secondary | ICD-10-CM | POA: Diagnosis not present

## 2022-01-19 DIAGNOSIS — N912 Amenorrhea, unspecified: Secondary | ICD-10-CM

## 2022-01-19 DIAGNOSIS — Z124 Encounter for screening for malignant neoplasm of cervix: Secondary | ICD-10-CM

## 2022-01-19 DIAGNOSIS — Z113 Encounter for screening for infections with a predominantly sexual mode of transmission: Secondary | ICD-10-CM

## 2022-01-19 NOTE — Progress Notes (Signed)
Gynecology Annual Exam  PCP: Excell Seltzer, MD  Chief Complaint: No chief complaint on file.   History of Present Illness: Patient is a 46 y.o. No obstetric history on file. presents for annual exam. The patient reports that her cycles are starting to change. They are coming less frequently and lasting longer. Her mother had a hysterectomy in her 4's, unsure of when women in her family start menopause.   LMP: Patient's last menstrual period was 12/09/2021. Average Interval: irregular,  Duration of flow: 5 days but the last period lasted longer Heavy Menses: yes, On day 1 and 2  needs to change pas every 2-3 hours Clots: yes Intermenstrual Bleeding: no Postcoital Bleeding: no Dysmenorrhea: yes, cramps resolve with Motrin and heating pad    The patient is sexually active. She currently uses condoms for contraception. She occasionally has  dyspareunia.  The patient does perform self breast exams.  There is notable family history of breast or ovarian cancer in her family.  The patient wears seatbelts: yes.   The patient has regular exercise:  is going to get a gym pass and intends to start going with her son on a regular basis.   The patient denies current symptoms of depression.   Describes stress level as varies, she knows she turns to food when she is stressed, loves to be with her puppies and to read.  Struggling with weight, had bariatric surgery-lost a good amount of weight, but now it has come back, recently hurt her knee so has not been able to be as active as she would like.  Works at [employer] with husband and 4 kids, feels safe   Review of Systems: Review of Systems  Constitutional:  Negative for chills, malaise/fatigue and weight loss.  HENT: Negative.    Respiratory: Negative.    Cardiovascular: Negative.   Gastrointestinal: Negative.   Genitourinary: Negative.   Musculoskeletal: Negative.   Skin: Negative.   Neurological: Negative.   Endo/Heme/Allergies:         Thinning hair Changes in menstrual cycle   Psychiatric/Behavioral: Negative.     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    Qualifier: Diagnosis of  By: Ermalene Searing MD, Amy      ALLERGIC RHINITIS DUE TO POLLEN 09/04/2009    Qualifier: Diagnosis of  By: Ermalene Searing MD, Amy      ASTHMA, INTERMITTENT, MILD 09/04/2009    Qualifier: Diagnosis of  By: Ermalene Searing MD, Amy      PATELLO-FEMORAL SYNDROME 09/04/2009    Qualifier: Diagnosis of  By: Ermalene Searing MD, Amy       Past Surgical History:  Past Surgical History:  Procedure Laterality Date   CESAREAN SECTION     CHONDROPLASTY Left 12/23/2017   Procedure: CHONDROPLASTY;  Surgeon: Signa Kell, MD;  Location: [facility];  Service: Orthopedics;  Laterality: Left;   GASTRIC BYPASS     KNEE ARTHROSCOPY Left 12/23/2017   Procedure: ARTHROSCOPY KNEE WITH LOOSE BODY  REMOVAL;  Surgeon: Signa Kell, MD;  Location: [facility];  Service: Orthopedics;  Laterality: Left;    Gynecologic History:  Patient's last menstrual period was 12/09/2021. Contraception: condoms Last Pap: Results were: 2020 NIL and HR HPV+  Last mammogram: 2 years ago Results were:  WNL Lamaya's sister recently had BCRA testing, Madalynn pretty sure she does not wanted to be tested  Obstetric History: No obstetric history on file.  Family History:  Family History  Problem Relation Age of Onset   Arthritis Mother    Cervical cancer Mother    Alcoholism Paternal Grandfather    Stomach cancer Paternal Grandfather     Heart disease Maternal Grandfather    Diabetes Maternal Grandmother    Breast cancer Neg Hx     Social History:  Social History   Socioeconomic History   Marital status: Married    Spouse name: Not on file   Number of children: Not on file   Years of education: Not on file   Highest education level: Not on file  Occupational History   Not on file  Tobacco Use   Smoking status: Never   Smokeless tobacco: Never  Vaping Use   Vaping Use: Never used  Substance and Sexual Activity   Alcohol use: No    Alcohol/week: 0.0 standard drinks   Drug use: No   Sexual activity: Yes  Other Topics Concern   Not on file  Social History Narrative   Not on file   Social Determinants of Health   Financial Resource Strain: Not on file  Food Insecurity: Not on file  Transportation Needs: Not on file  Physical Activity: Not on file  Stress: Not on file  Social Connections: Not on file  Intimate Partner Violence: Not on file    Allergies:  Allergies  Allergen Reactions   Morphine And Related Hives and Shortness Of Breath    rash    Medications: Prior to Admission medications   Medication Sig Start Date End Date Taking? Authorizing Provider  Levothyroxine Sodium 100 MCG CAPS levothyroxine 100 mcg capsule   Yes [provider]  meloxicam (MOBIC) 7.5 MG tablet Take 7.5 mg by mouth 2 (two) times daily. 12/02/21  Yes [provider]    Physical Exam Vitals: Blood pressure 122/80, height 5\' 7"  (1.702 m), weight (!) 347 lb (157.4 kg), last menstrual period 12/09/2021.  General: NAD HEENT: normocephalic, anicteric Thyroid: no enlargement, no palpable nodules Pulmonary: No increased work of breathing, CTAB Cardiovascular: RRR, distal pulses 2+ Breast: Breast symmetrical, no tenderness, no palpable nodules or masses, no skin or nipple retraction present, no nipple discharge.  No axillary or supraclavicular lymphadenopathy. Abdomen: NABS, soft, non-tender,  non-distended.  Umbilicus without lesions.  No hepatomegaly, splenomegaly or masses palpable. No evidence of hernia  Genitourinary:  External: Normal external female genitalia.  Normal urethral meatus, normal Bartholin's and Skene's glands.    Vagina: Normal vaginal mucosa, no evidence of prolapse.  Good tone   Cervix: Extra long speculum used, Grossly normal in appearance, no bleeding  Uterus: Non-enlarged, mobile, normal contour.  No CMT  Adnexa: ovaries non-enlarged, no adnexal masses  Rectal: deferred  Lymphatic: no evidence of inguinal lymphadenopathy Extremities: no edema, erythema, or tenderness Neurologic: Grossly intact Psychiatric: mood appropriate, affect full   Assessment: 46 y.o. No obstetric history on file. routine annual exam  Plan: Problem List Items Addressed This Visit   None Visit Diagnoses     Well woman exam    -  Primary  Relevant Orders   HEP, RPR, HIV Panel   Hepatitis C antibody   FSH/LH   Cytology - PAP   Cervical cancer screening       Relevant Orders   Cytology - PAP   Screening examination for venereal disease       Relevant Orders   HEP, RPR, HIV Panel   Hepatitis C antibody   Irregular periods/menstrual cycles       Relevant Orders   FSH/LH       1) Mammogram - recommend yearly screening mammogram.  Mammogram  will self refer when desired, but is due    2) STI screening  wasoffered and accepted  3) ASCCP guidelines and rational discussed.  Patient opts for every 5 years screening interval  4) Contraception - the patient is currently using  condoms.  She is happy with her current form of contraception and plans to continue  5) Colonoscopy -- Screening recommended starting at age 82 for average risk individuals, age 7 for individuals deemed at increased risk (including African Americans) and recommended to continue until age 80.  For patient age 30-85 individualized approach is recommended.  Gold standard screening is via colonoscopy,  Cologuard screening is an acceptable alternative for patient unwilling or unable to undergo colonoscopy.  "Colorectal cancer screening for average?risk adults: 2018 guideline update from the American Cancer Society"CA: A Cancer Journal for Clinicians: May 04, 2017   6) Routine healthcare maintenance including cholesterol, diabetes screening discussed managed by PCP  7) Reviewed changes in cycle probably related to entering perimenopause, briefly reviewed hormonal management-undecided right now-was told she is not a good candidate for estrogen, attempted to have an IUD inserted a while ago and was not able to be placed. RTC if you desire management for perimenopausal symptoms. Otherwise return in 1 year.   Carie Caddy, CNM  [facility], [facility] 01/19/2022, 3:01 PM

## 2022-01-20 ENCOUNTER — Encounter: Payer: Self-pay | Admitting: Licensed Practical Nurse

## 2022-01-20 DIAGNOSIS — N926 Irregular menstruation, unspecified: Secondary | ICD-10-CM | POA: Insufficient documentation

## 2022-01-20 LAB — HEP, RPR, HIV PANEL
HIV Screen 4th Generation wRfx: NONREACTIVE
Hepatitis B Surface Ag: NEGATIVE
RPR Ser Ql: NONREACTIVE

## 2022-01-20 LAB — HEPATITIS C ANTIBODY: Hep C Virus Ab: NONREACTIVE

## 2022-01-20 LAB — FSH/LH
FSH: 6.9 m[IU]/mL
LH: 10 m[IU]/mL

## 2022-01-20 LAB — BETA HCG QUANT (REF LAB): hCG Quant: <1 m[IU]/mL

## 2022-01-26 ENCOUNTER — Encounter: Payer: Self-pay | Admitting: Licensed Practical Nurse

## 2022-01-26 LAB — IGP,CTNG,RFXAPTHPV,RFX16/18,45
Chlamydia, Nuc. Acid Amp: NEGATIVE
Gonococcus by Nucleic Acid Amp: NEGATIVE

## 2022-03-01 ENCOUNTER — Other Ambulatory Visit: Payer: Self-pay | Admitting: Family Medicine

## 2022-03-01 NOTE — Telephone Encounter (Signed)
Last office visit 12/21/2019.  Last refilled ?  Last Thyroid labs 06/30/2021.  No future appointments with PCP.  Refill? ?

## 2022-03-09 ENCOUNTER — Encounter: Payer: Self-pay | Admitting: Licensed Practical Nurse

## 2022-06-28 ENCOUNTER — Inpatient Hospital Stay: Payer: Managed Care, Other (non HMO) | Admitting: Medical Oncology

## 2022-06-28 ENCOUNTER — Inpatient Hospital Stay: Payer: Managed Care, Other (non HMO)

## 2022-06-28 ENCOUNTER — Ambulatory Visit: Payer: Managed Care, Other (non HMO) | Admitting: Internal Medicine

## 2022-07-02 ENCOUNTER — Telehealth: Payer: Self-pay | Admitting: Nurse Practitioner

## 2022-07-02 MED FILL — Iron Sucrose Inj 20 MG/ML (Fe Equiv): INTRAVENOUS | Qty: 10 | Status: AC

## 2022-07-02 NOTE — Telephone Encounter (Signed)
pt called in to have appt canceled, states that she will have labs done off campus and call back to have appts scheduled

## 2022-07-05 ENCOUNTER — Inpatient Hospital Stay: Payer: Managed Care, Other (non HMO) | Admitting: Nurse Practitioner

## 2022-07-05 ENCOUNTER — Inpatient Hospital Stay: Payer: Managed Care, Other (non HMO)

## 2022-07-20 ENCOUNTER — Encounter: Payer: Self-pay | Admitting: Internal Medicine

## 2022-07-22 ENCOUNTER — Telehealth: Payer: Self-pay | Admitting: Internal Medicine

## 2022-07-22 NOTE — Telephone Encounter (Signed)
pt called in to req having venofer. Says she had labs done through [facility] and believes her levels were low. advised that team will review information and advise for next appts

## 2022-07-30 ENCOUNTER — Inpatient Hospital Stay: Payer: Managed Care, Other (non HMO) | Attending: Nurse Practitioner | Admitting: Nurse Practitioner

## 2022-07-30 ENCOUNTER — Inpatient Hospital Stay: Payer: Managed Care, Other (non HMO)

## 2022-07-30 VITALS — BP 114/80 | HR 60 | Temp 97.9°F | Resp 18

## 2022-07-30 VITALS — BP 111/81 | HR 16 | Temp 99.0°F | Resp 80 | Ht 67.0 in | Wt 353.0 lb

## 2022-07-30 DIAGNOSIS — E538 Deficiency of other specified B group vitamins: Secondary | ICD-10-CM | POA: Diagnosis not present

## 2022-07-30 DIAGNOSIS — K9589 Other complications of other bariatric procedure: Secondary | ICD-10-CM

## 2022-07-30 DIAGNOSIS — D509 Iron deficiency anemia, unspecified: Secondary | ICD-10-CM | POA: Insufficient documentation

## 2022-07-30 DIAGNOSIS — D508 Other iron deficiency anemias: Secondary | ICD-10-CM | POA: Diagnosis not present

## 2022-07-30 DIAGNOSIS — E039 Hypothyroidism, unspecified: Secondary | ICD-10-CM | POA: Diagnosis not present

## 2022-07-30 DIAGNOSIS — E162 Hypoglycemia, unspecified: Secondary | ICD-10-CM | POA: Diagnosis not present

## 2022-07-30 MED ORDER — SODIUM CHLORIDE 0.9 % IV SOLN
Freq: Once | INTRAVENOUS | Status: AC
  Filled 2022-07-30: qty 250

## 2022-07-30 MED ORDER — SODIUM CHLORIDE 0.9 % IV SOLN
200.0000 mg | Freq: Once | INTRAVENOUS | Status: AC
  Administered 2022-07-30: 200 mg via INTRAVENOUS
  Filled 2022-07-30: qty 200

## 2022-07-30 NOTE — Progress Notes (Signed)
[facility] OFFICE PROGRESS NOTE  Patient Care Team: Excell Seltzer, MD as PCP - General   SUMMARY OF ONCOLOGIC HISTORY:  # 2006- IRON DEFICIENCY ANEMIA sec Malabsorption/ gastric bypass, Roux-en-Y; IV Ferrahem every 6 months-maintenance; sublingual B12  # Hypothyroidism  INTERVAL HISTORY: 46 y.o. female who returns to clinic for follow up for iron deficiency anemia. Denies any neurologic complaints. Denies recent fevers or illnesses. Denies any easy bleeding or bruising. No melena or hematochezia. No pica or restless leg. Reports good appetite and denies weight loss. Denies chest pain. Denies any nausea, vomiting, constipation, or diarrhea. Denies urinary complaints. Patient offers no further specific complaints today.  Review of Systems  Constitutional:  Positive for malaise/fatigue. Negative for chills, diaphoresis, fever and weight loss.  HENT:  Negative for nosebleeds and sore throat.   Eyes:  Negative for double vision.  Respiratory:  Negative for cough, hemoptysis, sputum production, shortness of breath and wheezing.   Cardiovascular:  Negative for chest pain, palpitations, orthopnea and leg swelling.  Gastrointestinal:  Negative for abdominal pain, blood in stool, constipation, diarrhea, heartburn, melena, nausea and vomiting.  Genitourinary:  Negative for dysuria, frequency and urgency.  Musculoskeletal:  Negative for back pain and joint pain.  Neurological:  Negative for dizziness, tingling, focal weakness, weakness and headaches.  Endo/Heme/Allergies:  Does not bruise/bleed easily.  Psychiatric/Behavioral:  Negative for depression. The patient is not nervous/anxious and does not have insomnia.     PAST MEDICAL HISTORY :  Past Medical History:  Diagnosis Date   Allergy    Asthma    Depression    Hypothyroidism    Iron deficiency anemia    PONV (postoperative nausea and vomiting)    HARD TIME WAKING AFTER ENDOSCOPY   Postprandial hypoglycemia     PAST  SURGICAL HISTORY :   Past Surgical History:  Procedure Laterality Date   CESAREAN SECTION     CHONDROPLASTY Left 12/23/2017   Procedure: CHONDROPLASTY;  Surgeon: Signa Kell, MD;  Location: [facility];  Service: Orthopedics;  Laterality: Left;   GASTRIC BYPASS     KNEE ARTHROSCOPY Left 12/23/2017   Procedure: ARTHROSCOPY KNEE WITH LOOSE BODY REMOVAL;  Surgeon: Signa Kell, MD;  Location: [facility];  Service: Orthopedics;  Laterality: Left;    FAMILY HISTORY :   Family History  Problem Relation Age of Onset   Arthritis Mother    Cervical cancer Mother    Alcoholism Paternal Grandfather    Stomach cancer Paternal Grandfather    Heart disease Maternal Grandfather    Diabetes Maternal Grandmother    Breast cancer Neg Hx     SOCIAL HISTORY:   Social History   Tobacco Use   Smoking status: Never   Smokeless tobacco: Never  Vaping Use   Vaping Use: Never used  Substance Use Topics   Alcohol use: No    Alcohol/week: 0.0 standard drinks of alcohol   Drug use: No    ALLERGIES:  is allergic to morphine and related.  MEDICATIONS:  Current Outpatient Medications  Medication Sig Dispense Refill   levothyroxine (SYNTHROID) 100 MCG tablet Take 100 mcg by mouth daily.     meloxicam (MOBIC) 7.5 MG tablet Take 7.5 mg by mouth 2 (two) times daily. (Patient not taking: Reported on 07/30/2022)     No current facility-administered medications for this visit.    PHYSICAL EXAMINATION: ECOG PERFORMANCE STATUS: 1 - Symptomatic but completely ambulatory  BP 111/81   Pulse (!) 16   Temp 99 F (37.2  C) (Tympanic)   Resp (!) 80   Ht 5\' 7"  (1.702 m)   Wt (!) 353 lb (160.1 kg)   BMI 55.29 kg/m   Filed Weights   07/30/22 1400  Weight: (!) 353 lb (160.1 kg)    Physical Exam Constitutional:      Appearance: She is not ill-appearing.  Eyes:     General: No scleral icterus.    Conjunctiva/sclera: Conjunctivae normal.  Cardiovascular:     Rate and Rhythm: Normal rate and regular rhythm.   Abdominal:     General: There is no distension.     Palpations: Abdomen is soft.     Tenderness: There is no abdominal tenderness. There is no guarding.  Musculoskeletal:        General: No deformity.     Right lower leg: No edema.     Left lower leg: No edema.  Lymphadenopathy:     Cervical: No cervical adenopathy.  Skin:    General: Skin is warm and dry.  Neurological:     Mental Status: She is alert and oriented to person, place, and time. Mental status is at baseline.  Psychiatric:        Mood and Affect: Mood normal.        Behavior: Behavior normal.     LABORATORY DATA:      ASSESSMENT & PLAN:   No problem-specific Assessment & Plan notes found for this encounter.   # Iron Deficiency - secondary to malabsorption post gastric bypass. [facility] labs from 07/19/22 were reviewed. Hemoglobin- 10.8, microcytic. Ferritin 6, iron sat 6%. Symptomatic. Recommend venofer x 5. She prefers convenience of feraheme which she has received in the past. If approved by insurance, she wishes to receive feraheme in the future.   # Hypoglycemia- metabolic profile was not checked at this visit. Previously, glucose was low. Encouraged patient to follow up with pcp.   # B12 deficiency- secondary to malabsorption. Sublingual b12. B12 365. Continue b12.  Disposition:  Venofer x 5 (first one today) 6 mo- labs (may also be performed at [facility] if she prefers), Dr 07/21/22, +/- feraheme if ok'd by insurance, otherwise venofer- la   Donneta Romberg, NP 07/30/2022

## 2022-07-30 NOTE — Progress Notes (Signed)
Returns for follow-up of iron deficiency anemia and iron infusion today. Other than fatigue, reports no new concerns.

## 2022-08-06 ENCOUNTER — Inpatient Hospital Stay: Payer: Managed Care, Other (non HMO) | Attending: Nurse Practitioner

## 2022-08-06 VITALS — BP 121/79 | HR 67 | Temp 98.3°F | Resp 19

## 2022-08-06 DIAGNOSIS — K9589 Other complications of other bariatric procedure: Secondary | ICD-10-CM

## 2022-08-06 DIAGNOSIS — D509 Iron deficiency anemia, unspecified: Secondary | ICD-10-CM | POA: Insufficient documentation

## 2022-08-06 MED ORDER — SODIUM CHLORIDE 0.9 % IV SOLN
Freq: Once | INTRAVENOUS | Status: AC
  Filled 2022-08-06: qty 250

## 2022-08-06 MED ORDER — SODIUM CHLORIDE 0.9 % IV SOLN
200.0000 mg | Freq: Once | INTRAVENOUS | Status: AC
  Administered 2022-08-06: 200 mg via INTRAVENOUS
  Filled 2022-08-06: qty 200

## 2022-08-13 ENCOUNTER — Inpatient Hospital Stay: Payer: Managed Care, Other (non HMO)

## 2022-08-13 VITALS — BP 127/73 | HR 65 | Temp 98.7°F

## 2022-08-13 DIAGNOSIS — D508 Other iron deficiency anemias: Secondary | ICD-10-CM

## 2022-08-13 DIAGNOSIS — D509 Iron deficiency anemia, unspecified: Secondary | ICD-10-CM | POA: Diagnosis not present

## 2022-08-13 MED ORDER — SODIUM CHLORIDE 0.9% FLUSH
10.0000 mL | Freq: Once | INTRAVENOUS | Status: AC | PRN
  Administered 2022-08-13: 10 mL
  Filled 2022-08-13: qty 10

## 2022-08-13 MED ORDER — SODIUM CHLORIDE 0.9 % IV SOLN
Freq: Once | INTRAVENOUS | Status: AC
  Filled 2022-08-13: qty 250

## 2022-08-13 MED ORDER — SODIUM CHLORIDE 0.9 % IV SOLN
200.0000 mg | Freq: Once | INTRAVENOUS | Status: AC
  Administered 2022-08-13: 200 mg via INTRAVENOUS
  Filled 2022-08-13: qty 200

## 2022-08-13 NOTE — Patient Instructions (Signed)

## 2022-08-17 ENCOUNTER — Encounter: Payer: Self-pay | Admitting: Family Medicine

## 2022-08-17 DIAGNOSIS — Z8616 Personal history of COVID-19: Secondary | ICD-10-CM

## 2022-08-17 HISTORY — DX: Personal history of COVID-19: Z86.16

## 2022-08-17 NOTE — Telephone Encounter (Signed)
Noted  

## 2022-08-17 NOTE — Telephone Encounter (Signed)
I  spoke with pt;pt said starting on 08/15/22 pt began with symptoms of head and chest congestion, sinus H/A, chills,fatigue;now has prod cough but pt has not looked at color of phlegm, pt has wheezing with deep breath and SOB upon movement. Pt is lightheaded with H/A pain level of 6.  Today pt has lower back pain on both sides with on and off dull achy back pain. No frequency or pain upon urination. Pt had + covid test 08/17/22. Pt said she is afraid to drive with how she feels and Pt said when her husband gets off work this afternoon pt is going to [employer]. UC & ED precautions given and pt voiced understanding. Sending note to Dr Ermalene Searing and Lupita Leash CMA.

## 2022-08-20 ENCOUNTER — Ambulatory Visit: Payer: Managed Care, Other (non HMO)

## 2022-08-27 ENCOUNTER — Ambulatory Visit: Payer: Managed Care, Other (non HMO)

## 2022-09-03 ENCOUNTER — Inpatient Hospital Stay: Payer: Managed Care, Other (non HMO)

## 2022-09-03 VITALS — BP 110/70 | HR 67 | Temp 97.9°F | Resp 18

## 2022-09-03 DIAGNOSIS — D508 Other iron deficiency anemias: Secondary | ICD-10-CM

## 2022-09-03 DIAGNOSIS — D509 Iron deficiency anemia, unspecified: Secondary | ICD-10-CM | POA: Diagnosis not present

## 2022-09-03 MED ORDER — SODIUM CHLORIDE 0.9 % IV SOLN
200.0000 mg | Freq: Once | INTRAVENOUS | Status: AC
  Administered 2022-09-03: 200 mg via INTRAVENOUS
  Filled 2022-09-03: qty 200

## 2022-09-03 MED ORDER — SODIUM CHLORIDE 0.9 % IV SOLN
Freq: Once | INTRAVENOUS | Status: AC
  Filled 2022-09-03: qty 250

## 2022-09-03 NOTE — Patient Instructions (Signed)
[facility]  Discharge Instructions: Thank you for choosing [facility] to provide your oncology and hematology care.  If you have a lab appointment with the Cancer Center, please go directly to the Cancer Center and check in at the registration area.  Wear comfortable clothing and clothing appropriate for easy access to any Portacath or PICC line.   We strive to give you quality time with your provider. You may need to reschedule your appointment if you arrive late (15 or more minutes).  Arriving late affects you and other patients whose appointments are after yours.  Also, if you miss three or more appointments without notifying the office, you may be dismissed from the clinic at the provider's discretion.      For prescription refill requests, have your pharmacy contact our office and allow 72 hours for refills to be completed.    Today you received the following chemotherapy and/or immunotherapy agents Venofer.      To help prevent nausea and vomiting after your treatment, we encourage you to take your nausea medication as directed.  BELOW ARE SYMPTOMS THAT SHOULD BE REPORTED IMMEDIATELY: *FEVER GREATER THAN 100.4 F (38 C) OR HIGHER *CHILLS OR SWEATING *NAUSEA AND VOMITING THAT IS NOT CONTROLLED WITH YOUR NAUSEA MEDICATION *UNUSUAL SHORTNESS OF BREATH *UNUSUAL BRUISING OR BLEEDING *URINARY PROBLEMS (pain or burning when urinating, or frequent urination) *BOWEL PROBLEMS (unusual diarrhea, constipation, pain near the anus) TENDERNESS IN MOUTH AND THROAT WITH OR WITHOUT PRESENCE OF ULCERS (sore throat, sores in mouth, or a toothache) UNUSUAL RASH, SWELLING OR PAIN  UNUSUAL VAGINAL DISCHARGE OR ITCHING   Items with * indicate a potential emergency and should be followed up as soon as possible or go to the Emergency Department if any problems should occur.  Please show the CHEMOTHERAPY ALERT CARD or IMMUNOTHERAPY ALERT CARD at check-in to  the Emergency Department and triage nurse.  Should you have questions after your visit or need to cancel or reschedule your appointment, please contact [facility]  [phone] and follow the prompts.  Office hours are 8:00 a.m. to 4:30 p.m. Monday - Friday. Please note that voicemails left after 4:00 p.m. may not be returned until the following business day.  We are closed weekends and major holidays. You have access to a nurse at all times for urgent questions. Please call the main number to the clinic [phone] and follow the prompts.  For any non-urgent questions, you may also contact your provider using MyChart. We now offer e-Visits for anyone 18 and older to request care online for non-urgent symptoms. For details visit [email or web address].   Also download the MyChart app! Go to the app store, search "MyChart", open the app, select , and log in with your MyChart username and password.  Masks are optional in the cancer centers. If you would like for your care team to wear a mask while they are taking care of you, please let them know. For doctor visits, patients may have with them one support person who is at least 45 years old. At this time, visitors are not allowed in the infusion area.   

## 2022-09-10 ENCOUNTER — Ambulatory Visit: Payer: Managed Care, Other (non HMO)

## 2022-09-17 ENCOUNTER — Other Ambulatory Visit: Payer: Self-pay | Admitting: Orthopedic Surgery

## 2022-09-17 DIAGNOSIS — M25561 Pain in right knee: Secondary | ICD-10-CM

## 2022-09-17 DIAGNOSIS — M2341 Loose body in knee, right knee: Secondary | ICD-10-CM

## 2022-09-24 ENCOUNTER — Inpatient Hospital Stay: Payer: Managed Care, Other (non HMO) | Attending: Nurse Practitioner

## 2022-09-24 VITALS — BP 102/82 | HR 69 | Temp 98.1°F | Resp 18

## 2022-09-24 DIAGNOSIS — D509 Iron deficiency anemia, unspecified: Secondary | ICD-10-CM | POA: Insufficient documentation

## 2022-09-24 DIAGNOSIS — D508 Other iron deficiency anemias: Secondary | ICD-10-CM

## 2022-09-24 MED ORDER — SODIUM CHLORIDE 0.9 % IV SOLN
200.0000 mg | Freq: Once | INTRAVENOUS | Status: AC
  Administered 2022-09-24: 200 mg via INTRAVENOUS
  Filled 2022-09-24: qty 200

## 2022-09-24 MED ORDER — SODIUM CHLORIDE 0.9 % IV SOLN
Freq: Once | INTRAVENOUS | Status: AC
  Filled 2022-09-24: qty 250

## 2022-09-24 NOTE — Patient Instructions (Signed)

## 2022-09-24 NOTE — Progress Notes (Signed)
Pt tolerated iron infusion well.  No complaints.  VSS.  Pt denied 30 minute post obs.

## 2022-09-30 ENCOUNTER — Ambulatory Visit
Admission: RE | Admit: 2022-09-30 | Discharge: 2022-09-30 | Disposition: A | Discharge status: Home / Self Care | Payer: Managed Care, Other (non HMO) | Source: Ambulatory Visit | Attending: Orthopedic Surgery | Admitting: Orthopedic Surgery

## 2022-09-30 DIAGNOSIS — M2341 Loose body in knee, right knee: Secondary | ICD-10-CM

## 2022-09-30 DIAGNOSIS — M25561 Pain in right knee: Secondary | ICD-10-CM

## 2022-10-07 ENCOUNTER — Other Ambulatory Visit: Payer: Self-pay | Admitting: Orthopedic Surgery

## 2022-10-14 ENCOUNTER — Encounter
Admission: RE | Admit: 2022-10-14 | Discharge: 2022-10-14 | Disposition: A | Discharge status: Home / Self Care | Payer: Managed Care, Other (non HMO) | Source: Ambulatory Visit | Attending: Orthopedic Surgery | Admitting: Orthopedic Surgery

## 2022-10-14 ENCOUNTER — Other Ambulatory Visit: Payer: Self-pay

## 2022-10-14 VITALS — Ht 67.0 in | Wt 350.0 lb

## 2022-10-14 DIAGNOSIS — N926 Irregular menstruation, unspecified: Secondary | ICD-10-CM

## 2022-10-14 NOTE — Progress Notes (Signed)
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 Signa Kell, MD. During patient's preoperative assessment, weight documented 158.8 kg giving her a BMI of 54.82 kg/m.  Reached out to patient's PCP obtain preoperative clearance given elevated BMI.  Return call received from patient indicating that she is unable to get in with PCP, and has been >2 years since she was seen in clinic.  Patient indicating that she is routinely seen and the cancer center here at [facility] for routine labs related to her known IDA.  Patient has a past surgical history of Roux-en-Y bypass, however following surgery, patient has had 3 children resulting in weight gain.  Call placed to patient for further assessment and discussion.  Patient advises that she does not have any type of known issues with her cervical or lumbar spine.  She has not had any type of surgical procedures, whether with or without hardware placement, on her neck or lower back.   Patient denies previous perioperative complications with anesthesia in the past.   She has never experienced difficulties with endotracheal intubation. She advises that she has never been advised by a medical provider that she had a challenging airway due to her anatomy.  Patient has never been diagnosed with OSAH  syndrome requiring the use of prescribed nocturnal PAP therapy.   In review the patient's available EMR, it is noted that she underwent a general anesthetic course here at [facility] (ASA II) in 12/2017 with no documented complications.   Impression and Plan:  Kelly Barr noted to have an elevated BMI of 54.82 kg/m on preoperative assessment.  Unable to obtain medical clearance, as patient has essentially been lost to follow-up with her PCP.  With that being said, patient does not have any known cervical spine issues limiting mobility.  She denies OSAH syndrome.  Patient has not experienced any recent episodes of shortness of breath or chest pain. Patient does not smoke.  She is able to lie flat without issues.  Of note, patient did have SARS-CoV-2 on 08/17/2022, however she does not have any residual symptoms/limitations following viral illness.  Patient underwent general anesthesia course here and in 2019, at which time her thyromental distance was documented as being >3 FB and her Mallampati score was II.  Following clinical review, there are no anticipated issues with patient proceeding with planned surgical intervention as scheduled on 10/19/2022. Any acute changes in clinical condition may necessitate her procedure being postponed and/or cancelled. Patient will meet with anesthesia team (MD and/or CRNA) on this day of her procedure for preoperative evaluation/assessment.   Pre-surgical instructions were reviewed with the patient during her PAT appointment and questions were fielded by PAT clinical staff. Patient was advised that if any questions or concerns arise prior to her procedure then she should return a  call to PAT and/or her surgeon's office to discuss.  Quentin Mulling, MSN, APRN, FNP-C, CEN [facility]  Peri-operative Services Nurse Practitioner Phone: [phone] 10/14/22 12:03 PM  NOTE: This note has been prepared using Dragon  dictation software. Despite my best ability to proofread, there is always the potential that unintentional transcriptional errors may still occur from this process.

## 2022-10-14 NOTE — Patient Instructions (Signed)
Your procedure is scheduled on: 10/19/22 Report to DAY SURGERY DEPARTMENT LOCATED ON 2ND FLOOR MEDICAL MALL ENTRANCE. To find out your arrival time please call [phone] between 1PM - 3PM on 10/18/22.  Remember: Instructions that are not followed completely may result in serious medical risk, up to and including death, or upon the discretion of your surgeon and anesthesiologist your surgery may need to be rescheduled.     _X__ 1. Do not eat food after midnight the night before your procedure.                 No gum chewing or hard candies. You may drink clear liquids up to 2 hours                 before you are scheduled to arrive for your surgery- DO not drink clear                 liquids within 2 hours of the start of your surgery.                 Clear Liquids include:  water, apple juice without pulp, clear carbohydrate                 drink such as Clearfast or Gatorade, Black Coffee or Tea (Do not add                 anything to coffee or tea). Diabetics water only  __X__2.  On the morning of surgery brush your teeth with toothpaste and water, you                 may rinse your mouth with mouthwash if you wish.  Do not swallow any              toothpaste of mouthwash.     _X__ 3.  No Alcohol for 24 hours before or after surgery.   _X__ 4.  Do Not Smoke or use e-cigarettes For 24 Hours Prior to Your Surgery.                 Do not use any chewable tobacco products for at least 6 hours prior to                 surgery.  ____  5.  Bring all medications with you on the day of surgery if instructed.   __X__  6.  Notify your doctor if there is any change in your medical condition      (cold, fever, infections).     Do not wear jewelry, make-up, hairpins, clips or nail polish. Do not wear lotions, powders, or perfumes. You may wear deodorant Do not shave body hair 48 hours prior to surgery. Men may shave face and neck. Do not bring valuables to the hospital.    [facility] is not  responsible for any belongings or valuables.  Contacts, dentures/partials or body piercings may not be worn into surgery. Bring a case for your contacts, glasses or hearing aids, a denture cup will be supplied. Leave your suitcase in the car. After surgery it may be brought to your room. For patients admitted to the hospital, discharge time is determined by your treatment team.   Patients discharged the day of surgery will not be allowed to drive home.    __X__ Take these medicines the morning of surgery with A SIP OF WATER:    1. levothyroxine (SYNTHROID) 100 MCG tablet   2.  3.   4.  5.  6.  ____ Fleet Enema (as directed)   ____ Use CHG Soap/SAGE wipes as directed  ____ Use inhalers on the day of surgery  ____ Stop metformin/Janumet/Farxiga 2 days prior to surgery    ____ Take 1/2 of usual insulin dose the night before surgery. No insulin the morning          of surgery.   ____ Stop Blood Thinners Coumadin/Plavix/Xarelto/Pleta/Pradaxa/Eliquis/Effient/Aspirin  on   Or contact your Surgeon, Cardiologist or Medical Doctor regarding  ability to stop your blood thinners  __X__ Stop Anti-inflammatories 7 days before surgery such as Advil, Ibuprofen, Motrin,  BC or Goodies Powder, Naprosyn, Naproxen, Aleve, Aspirin   You may take Tylenol as needed  __X__ Stop all herbals and supplements, fish oil or vitamins  until after surgery.    ____ Bring C-Pap to the hospital.

## 2022-10-15 ENCOUNTER — Ambulatory Visit: Payer: Managed Care, Other (non HMO) | Admitting: Family Medicine

## 2022-10-19 ENCOUNTER — Ambulatory Visit: Payer: Managed Care, Other (non HMO) | Admitting: Urgent Care

## 2022-10-19 ENCOUNTER — Other Ambulatory Visit: Payer: Self-pay

## 2022-10-19 ENCOUNTER — Encounter: Payer: Self-pay | Admitting: Orthopedic Surgery

## 2022-10-19 ENCOUNTER — Ambulatory Visit
Admission: RE | Admit: 2022-10-19 | Discharge: 2022-10-19 | Disposition: A | Discharge status: Home / Self Care | Payer: Managed Care, Other (non HMO) | Attending: Orthopedic Surgery | Admitting: Orthopedic Surgery

## 2022-10-19 ENCOUNTER — Encounter
Admission: RE | Disposition: A | Discharge status: Home / Self Care | Payer: Self-pay | Source: Home / Self Care | Attending: Orthopedic Surgery

## 2022-10-19 DIAGNOSIS — N926 Irregular menstruation, unspecified: Secondary | ICD-10-CM

## 2022-10-19 DIAGNOSIS — Z6841 Body Mass Index (BMI) 40.0 and over, adult: Secondary | ICD-10-CM | POA: Diagnosis not present

## 2022-10-19 DIAGNOSIS — M1711 Unilateral primary osteoarthritis, right knee: Secondary | ICD-10-CM | POA: Diagnosis not present

## 2022-10-19 DIAGNOSIS — M2341 Loose body in knee, right knee: Secondary | ICD-10-CM | POA: Insufficient documentation

## 2022-10-19 DIAGNOSIS — E039 Hypothyroidism, unspecified: Secondary | ICD-10-CM | POA: Insufficient documentation

## 2022-10-19 HISTORY — PX: KNEE ARTHROSCOPY WITH SUBCHONDROPLASTY: SHX6732

## 2022-10-19 LAB — POCT PREGNANCY, URINE: Preg Test, Ur: NEGATIVE

## 2022-10-19 SURGERY — ARTHROSCOPY, KNEE, WITH SUBCHONDROPLASTY
Anesthesia: General | Site: Knee | Laterality: Right

## 2022-10-19 MED ORDER — KETOROLAC TROMETHAMINE 30 MG/ML IJ SOLN
INTRAMUSCULAR | Status: DC | PRN
  Administered 2022-10-19: 30 mg via INTRAVENOUS

## 2022-10-19 MED ORDER — IBUPROFEN 800 MG PO TABS: 800.0000 mg | ORAL_TABLET | Freq: Three times a day (TID) | ORAL | 0 refills | Status: AC

## 2022-10-19 MED ORDER — DEXAMETHASONE SODIUM PHOSPHATE 10 MG/ML IJ SOLN
INTRAMUSCULAR | Status: DC | PRN
  Administered 2022-10-19: 10 mg via INTRAVENOUS

## 2022-10-19 MED ORDER — PROPOFOL 10 MG/ML IV BOLUS
INTRAVENOUS | Status: AC
  Filled 2022-10-19: qty 20

## 2022-10-19 MED ORDER — LACTATED RINGERS IV SOLN: INTRAVENOUS | Status: DC

## 2022-10-19 MED ORDER — PROPOFOL 10 MG/ML IV BOLUS
INTRAVENOUS | Status: DC | PRN
  Administered 2022-10-19: 200 mg via INTRAVENOUS

## 2022-10-19 MED ORDER — SUGAMMADEX SODIUM 200 MG/2ML IV SOLN
INTRAVENOUS | Status: DC | PRN
  Administered 2022-10-19: 400 mg via INTRAVENOUS

## 2022-10-19 MED ORDER — OXYCODONE HCL 5 MG/5ML PO SOLN: 5.0000 mg | Freq: Once | ORAL | Status: AC | PRN

## 2022-10-19 MED ORDER — CHLORHEXIDINE GLUCONATE 0.12 % MT SOLN
OROMUCOSAL | Status: AC
  Administered 2022-10-19: 15 mL via OROMUCOSAL
  Filled 2022-10-19: qty 15

## 2022-10-19 MED ORDER — CHLORHEXIDINE GLUCONATE 0.12 % MT SOLN: 15.0000 mL | Freq: Once | OROMUCOSAL | Status: AC

## 2022-10-19 MED ORDER — BUPIVACAINE HCL (PF) 0.5 % IJ SOLN
INTRAMUSCULAR | Status: AC
  Filled 2022-10-19: qty 30

## 2022-10-19 MED ORDER — DEXAMETHASONE SODIUM PHOSPHATE 10 MG/ML IJ SOLN
INTRAMUSCULAR | Status: AC
  Filled 2022-10-19: qty 1

## 2022-10-19 MED ORDER — MIDAZOLAM HCL 2 MG/2ML IJ SOLN
INTRAMUSCULAR | Status: AC
  Filled 2022-10-19: qty 2

## 2022-10-19 MED ORDER — LACTATED RINGERS IR SOLN
Status: DC | PRN
  Administered 2022-10-19: 2000 mL

## 2022-10-19 MED ORDER — ACETAMINOPHEN 10 MG/ML IV SOLN
INTRAVENOUS | Status: AC
  Filled 2022-10-19: qty 100

## 2022-10-19 MED ORDER — BUPIVACAINE-EPINEPHRINE (PF) 0.5% -1:200000 IJ SOLN
INTRAMUSCULAR | Status: AC
  Filled 2022-10-19: qty 30

## 2022-10-19 MED ORDER — FENTANYL CITRATE (PF) 100 MCG/2ML IJ SOLN
25.0000 ug | INTRAMUSCULAR | Status: DC | PRN
  Administered 2022-10-19: 25 ug via INTRAVENOUS

## 2022-10-19 MED ORDER — SUCCINYLCHOLINE CHLORIDE 200 MG/10ML IV SOSY
PREFILLED_SYRINGE | INTRAVENOUS | Status: AC
  Filled 2022-10-19: qty 10

## 2022-10-19 MED ORDER — KETOROLAC TROMETHAMINE 30 MG/ML IJ SOLN
INTRAMUSCULAR | Status: AC
  Filled 2022-10-19: qty 1

## 2022-10-19 MED ORDER — ONDANSETRON 4 MG PO TBDP: 4.0000 mg | ORAL_TABLET | Freq: Three times a day (TID) | ORAL | 0 refills | Status: DC | PRN

## 2022-10-19 MED ORDER — ACETAMINOPHEN 10 MG/ML IV SOLN
INTRAVENOUS | Status: DC | PRN
  Administered 2022-10-19: 1000 mg via INTRAVENOUS

## 2022-10-19 MED ORDER — EPINEPHRINE PF 1 MG/ML IJ SOLN
INTRAMUSCULAR | Status: AC
  Filled 2022-10-19: qty 4

## 2022-10-19 MED ORDER — CEFAZOLIN IN SODIUM CHLORIDE 3-0.9 GM/100ML-% IV SOLN
3.0000 g | INTRAVENOUS | Status: AC
  Administered 2022-10-19: 3 g via INTRAVENOUS
  Filled 2022-10-19: qty 100

## 2022-10-19 MED ORDER — BUPIVACAINE LIPOSOME 1.3 % IJ SUSP
INTRAMUSCULAR | Status: AC
  Filled 2022-10-19: qty 20

## 2022-10-19 MED ORDER — ASPIRIN 325 MG PO TBEC: 325.0000 mg | DELAYED_RELEASE_TABLET | Freq: Every day | ORAL | 0 refills | Status: AC

## 2022-10-19 MED ORDER — NEOMYCIN-POLYMYXIN B GU 40-200000 IR SOLN
Status: AC
  Filled 2022-10-19: qty 20

## 2022-10-19 MED ORDER — LIDOCAINE HCL (PF) 1 % IJ SOLN
INTRAMUSCULAR | Status: AC
  Filled 2022-10-19: qty 30

## 2022-10-19 MED ORDER — MIDAZOLAM HCL 2 MG/2ML IJ SOLN
INTRAMUSCULAR | Status: DC | PRN
  Administered 2022-10-19: 2 mg via INTRAVENOUS

## 2022-10-19 MED ORDER — LIDOCAINE HCL (CARDIAC) PF 100 MG/5ML IV SOSY
PREFILLED_SYRINGE | INTRAVENOUS | Status: DC | PRN
  Administered 2022-10-19: 100 mg via INTRAVENOUS

## 2022-10-19 MED ORDER — LIDOCAINE HCL (PF) 2 % IJ SOLN
INTRAMUSCULAR | Status: AC
  Filled 2022-10-19: qty 5

## 2022-10-19 MED ORDER — FENTANYL CITRATE (PF) 100 MCG/2ML IJ SOLN
INTRAMUSCULAR | Status: AC
  Administered 2022-10-19: 25 ug via INTRAVENOUS
  Filled 2022-10-19: qty 2

## 2022-10-19 MED ORDER — ROCURONIUM BROMIDE 10 MG/ML (PF) SYRINGE
PREFILLED_SYRINGE | INTRAVENOUS | Status: AC
  Filled 2022-10-19: qty 10

## 2022-10-19 MED ORDER — OXYCODONE HCL 5 MG PO TABS
ORAL_TABLET | ORAL | Status: AC
  Administered 2022-10-19: 5 mg via ORAL
  Filled 2022-10-19: qty 1

## 2022-10-19 MED ORDER — ORAL CARE MOUTH RINSE: 15.0000 mL | Freq: Once | OROMUCOSAL | Status: AC

## 2022-10-19 MED ORDER — PROPOFOL 500 MG/50ML IV EMUL
INTRAVENOUS | Status: DC | PRN
  Administered 2022-10-19: 80 ug/kg/min via INTRAVENOUS

## 2022-10-19 MED ORDER — FENTANYL CITRATE (PF) 100 MCG/2ML IJ SOLN
INTRAMUSCULAR | Status: DC | PRN
  Administered 2022-10-19 (×2): 50 ug via INTRAVENOUS

## 2022-10-19 MED ORDER — ROCURONIUM BROMIDE 100 MG/10ML IV SOLN
INTRAVENOUS | Status: DC | PRN
  Administered 2022-10-19: 60 mg via INTRAVENOUS

## 2022-10-19 MED ORDER — OXYCODONE HCL 5 MG PO TABS: 5.0000 mg | ORAL_TABLET | Freq: Once | ORAL | Status: AC | PRN

## 2022-10-19 MED ORDER — SUCCINYLCHOLINE CHLORIDE 200 MG/10ML IV SOSY
PREFILLED_SYRINGE | INTRAVENOUS | Status: DC | PRN
  Administered 2022-10-19: 100 mg via INTRAVENOUS

## 2022-10-19 MED ORDER — ACETAMINOPHEN 500 MG PO TABS: 1000.0000 mg | ORAL_TABLET | Freq: Three times a day (TID) | ORAL | 2 refills | Status: AC

## 2022-10-19 MED ORDER — ONDANSETRON HCL 4 MG/2ML IJ SOLN
INTRAMUSCULAR | Status: DC | PRN
  Administered 2022-10-19: 4 mg via INTRAVENOUS

## 2022-10-19 MED ORDER — LIDOCAINE HCL 1 % IJ SOLN
INTRAMUSCULAR | Status: DC | PRN
  Administered 2022-10-19: 15 mL via SURGICAL_CAVITY

## 2022-10-19 MED ORDER — FENTANYL CITRATE (PF) 100 MCG/2ML IJ SOLN
INTRAMUSCULAR | Status: AC
  Filled 2022-10-19: qty 2

## 2022-10-19 MED ORDER — ONDANSETRON HCL 4 MG/2ML IJ SOLN
INTRAMUSCULAR | Status: AC
  Filled 2022-10-19: qty 2

## 2022-10-19 MED ORDER — HYDROCODONE-ACETAMINOPHEN 5-325 MG PO TABS: 1.0000 | ORAL_TABLET | ORAL | 0 refills | Status: DC | PRN

## 2022-10-19 MED ORDER — PROPOFOL 1000 MG/100ML IV EMUL
INTRAVENOUS | Status: AC
  Filled 2022-10-19: qty 100

## 2022-10-19 SURGICAL SUPPLY — 56 items
ADAPTER IRRIG TUBE 2 SPIKE SOL (ADAPTER) ×2 IMPLANT
ADPR TBG 2 SPK PMP STRL ASCP (ADAPTER) ×2
APL PRP STRL LF DISP 70% ISPRP (MISCELLANEOUS) ×1
BLADE FULL RADIUS 3.5 (BLADE) IMPLANT
BLADE SHAVER 4.5X7 STR FR (MISCELLANEOUS) IMPLANT
BLADE SURG SZ11 CARB STEEL (BLADE) ×1 IMPLANT
BNDG CMPR 5X6 CHSV STRCH STRL (GAUZE/BANDAGES/DRESSINGS) ×1
BNDG COHESIVE 6X5 TAN ST LF (GAUZE/BANDAGES/DRESSINGS) ×1 IMPLANT
BNDG ELASTIC 6X5.8 VLCR STR LF (GAUZE/BANDAGES/DRESSINGS) ×1 IMPLANT
BNDG ESMARK 6X12 TAN STRL LF (GAUZE/BANDAGES/DRESSINGS) ×1 IMPLANT
BUR BR 5.5 WIDE MOUTH (BURR) IMPLANT
CHLORAPREP W/TINT 26 (MISCELLANEOUS) ×1 IMPLANT
COOLER POLAR GLACIER W/PUMP (MISCELLANEOUS) ×1 IMPLANT
COVER LIGHT HANDLE STERIS (MISCELLANEOUS) ×2 IMPLANT
CUFF TOURN SGL QUICK 24 (TOURNIQUET CUFF)
CUFF TOURN SGL QUICK 34 (TOURNIQUET CUFF)
CUFF TRNQT CYL 24X4X16.5-23 (TOURNIQUET CUFF) IMPLANT
CUFF TRNQT CYL 34X4.125X (TOURNIQUET CUFF) IMPLANT
DEVICE SUCT BLK HOLE OR FLOOR (MISCELLANEOUS) ×1 IMPLANT
DRAPE ARTHRO LIMB 89X125 STRL (DRAPES) ×2 IMPLANT
DRAPE IMP U-DRAPE 54X76 (DRAPES) ×1 IMPLANT
DRSG XEROFORM 1X8 (GAUZE/BANDAGES/DRESSINGS) IMPLANT
ELECT REM PT RETURN 9FT ADLT (ELECTROSURGICAL)
ELECTRODE REM PT RTRN 9FT ADLT (ELECTROSURGICAL) IMPLANT
GAUZE SPONGE 4X4 12PLY STRL (GAUZE/BANDAGES/DRESSINGS) ×1 IMPLANT
GLOVE BIOGEL PI IND STRL 8 (GLOVE) ×1 IMPLANT
GLOVE ORTHO TXT STRL SZ7.5 (GLOVE) ×1 IMPLANT
GLOVE SURG ORTHO 8.0 STRL STRW (GLOVE) ×2 IMPLANT
GOWN STRL REUS W/ TWL LRG LVL3 (GOWN DISPOSABLE) ×1 IMPLANT
GOWN STRL REUS W/ TWL XL LVL3 (GOWN DISPOSABLE) ×2 IMPLANT
GOWN STRL REUS W/TWL LRG LVL3 (GOWN DISPOSABLE) ×1
GOWN STRL REUS W/TWL XL LVL3 (GOWN DISPOSABLE) ×2
IV LACTATED RINGER IRRG 3000ML (IV SOLUTION) ×4
IV LR IRRIG 3000ML ARTHROMATIC (IV SOLUTION) ×4 IMPLANT
KIT TURNOVER KIT A (KITS) ×1 IMPLANT
MANIFOLD NEPTUNE II (INSTRUMENTS) ×2 IMPLANT
MAT ABSORB  FLUID 56X50 GRAY (MISCELLANEOUS) ×2
MAT ABSORB FLUID 56X50 GRAY (MISCELLANEOUS) ×2 IMPLANT
PACK ARTHROSCOPY KNEE (MISCELLANEOUS) ×1 IMPLANT
PAD ABD DERMACEA PRESS 5X9 (GAUZE/BANDAGES/DRESSINGS) ×2 IMPLANT
PAD WRAPON POLAR KNEE (MISCELLANEOUS) ×1 IMPLANT
PADDING CAST COTTON 6X4 STRL (CAST SUPPLIES) ×1 IMPLANT
SLEEVE REMOTE CONTROL 5X12 (DRAPES) IMPLANT
SUT ETHILON 3-0 FS-10 30 BLK (SUTURE) ×1
SUT MNCRL 4-0 (SUTURE) ×1
SUT MNCRL 4-0 27XMFL (SUTURE) ×1
SUT VIC AB 2-0 CT2 27 (SUTURE) ×1 IMPLANT
SUTURE EHLN 3-0 FS-10 30 BLK (SUTURE) ×1 IMPLANT
SUTURE MNCRL 4-0 27XMF (SUTURE) ×1 IMPLANT
TOWEL OR 17X26 4PK STRL BLUE (TOWEL DISPOSABLE) ×2 IMPLANT
TRAP FLUID SMOKE EVACUATOR (MISCELLANEOUS) ×1 IMPLANT
TUBING INFLOW SET DBFLO PUMP (TUBING) ×1 IMPLANT
TUBING OUTFLOW SET DBLFO PUMP (TUBING) ×1 IMPLANT
WAND WEREWOLF FLOW 90D (MISCELLANEOUS) IMPLANT
WATER STERILE IRR 500ML POUR (IV SOLUTION) ×1 IMPLANT
WRAPON POLAR PAD KNEE (MISCELLANEOUS) ×1

## 2022-10-19 NOTE — Anesthesia Procedure Notes (Signed)
Procedure Name: Intubation Date/Time: 10/19/2022 7:58 AM  Performed by: Morene Crocker, CRNAPre-anesthesia Checklist: Patient identified, Patient being monitored, Timeout performed, Emergency Drugs available and Suction available Patient Re-evaluated:Patient Re-evaluated prior to induction Oxygen Delivery Method: Circle system utilized Preoxygenation: Pre-oxygenation with 100% oxygen Induction Type: IV induction Ventilation: Mask ventilation without difficulty and Oral airway inserted - appropriate to patient size Laryngoscope Size: 3 and McGraph Grade View: Grade I Tube type: Oral Tube size: 7.0 mm Number of attempts: 1 Airway Equipment and Method: Stylet Placement Confirmation: ETT inserted through vocal cords under direct vision, positive ETCO2 and breath sounds checked- equal and bilateral Secured at: 22 cm Tube secured with: Tape Dental Injury: Teeth and Oropharynx as per pre-operative assessment  Comments: Pt. In ramped positioned using three folded blankets under shoulder for optimal positioning when intubating. Smooth intubation, no complications noted.

## 2022-10-19 NOTE — H&P (Signed)
Paper H&P to be scanned into permanent record. H&P reviewed. No significant changes noted.  

## 2022-10-19 NOTE — Anesthesia Preprocedure Evaluation (Signed)
Anesthesia Evaluation  Patient identified by MRN, date of birth, ID band Patient awake    Reviewed: Allergy & Precautions, NPO status , Patient's Chart, lab work & pertinent test results  History of Anesthesia Complications (+) PONV and history of anesthetic complications  Airway Mallampati: II  TM Distance: >3 FB Neck ROM: full    Dental  (+) Dental Advidsory Given, Poor Dentition   Pulmonary neg pulmonary ROS, neg shortness of breath, asthma , neg COPD   Pulmonary exam normal        Cardiovascular (-) angina (-) Past MI and (-) CABG negative cardio ROS Normal cardiovascular exam     Neuro/Psych  PSYCHIATRIC DISORDERS  Depression    negative neurological ROS  negative psych ROS   GI/Hepatic negative GI ROS, Neg liver ROS,,,  Endo/Other  Hypothyroidism  Morbid obesity  Renal/GU      Musculoskeletal   Abdominal   Peds  Hematology negative hematology ROS (+)   Anesthesia Other Findings Past Medical History: No date: Allergy No date: Asthma     Comment:  seasonal No date: Depression 08/17/2022: History of 2019 novel coronavirus disease (COVID-19) No date: Hypothyroidism No date: Iron deficiency anemia No date: PONV (postoperative nausea and vomiting)     Comment:  HARD TIME WAKING AFTER ENDOSCOPY No date: Postprandial hypoglycemia  Past Surgical History: No date: CESAREAN SECTION     Comment:  x2 12/23/2017: CHONDROPLASTY; Left     Comment:  Procedure: CHONDROPLASTY;  Surgeon: Signa Kell, MD;                Location: [facility] ORS;  Service: Orthopedics;  Laterality:               Left; No date: GASTRIC BYPASS 12/23/2017: KNEE ARTHROSCOPY; Left     Comment:  Procedure: ARTHROSCOPY KNEE WITH LOOSE BODY REMOVAL;                Surgeon: Signa Kell, MD;  Location: [facility] ORS;  Service:              Orthopedics;  Laterality: Left;  BMI    Body Mass Index: 54.82 kg/m      Reproductive/Obstetrics negative  OB ROS                             Anesthesia Physical Anesthesia Plan  ASA: 3  Anesthesia Plan: General ETT   Post-op Pain Management:    Induction: Intravenous  PONV Risk Score and Plan: 4 or greater and Ondansetron, Dexamethasone, Propofol infusion, TIVA and Midazolam  Airway Management Planned: Oral ETT  Additional Equipment:   Intra-op Plan:   Post-operative Plan: Extubation in OR  Informed Consent: I have reviewed the patients History and Physical, chart, labs and discussed the procedure including the risks, benefits and alternatives for the proposed anesthesia with the patient or authorized representative who has indicated his/her understanding and acceptance.     Dental Advisory Given  Plan Discussed with: Anesthesiologist, CRNA and Surgeon  Anesthesia Plan Comments: (Patient consented for risks of anesthesia including but not limited to:  - adverse reactions to medications - damage to eyes, teeth, lips or other oral mucosa - nerve damage due to positioning  - sore throat or hoarseness - Damage to heart, brain, nerves, lungs, other parts of body or loss of life  Patient voiced understanding.)       Anesthesia Quick Evaluation

## 2022-10-19 NOTE — Anesthesia Postprocedure Evaluation (Signed)
Anesthesia Post Note  Patient: BLAIZE NIPPER  Procedure(s) Performed: Right knee arthroscopic loose body removal with chondroplasty of the patellofemoral and medial compartments (Right: Knee)  Patient location during evaluation: PACU Anesthesia Type: General Level of consciousness: awake and alert Pain management: pain level controlled Vital Signs Assessment: post-procedure vital signs reviewed and stable Respiratory status: spontaneous breathing, nonlabored ventilation, respiratory function stable and patient connected to nasal cannula oxygen Cardiovascular status: blood pressure returned to baseline and stable Postop Assessment: no apparent nausea or vomiting Anesthetic complications: no  No notable events documented.   Last Vitals:  Vitals:   10/19/22 0930 10/19/22 1006  BP: 118/78 101/60  Pulse: (!) 50 66  Resp: (!) 7 20  Temp: (!) 36.4 C (!) 36.1 C  SpO2: 91% 100%    Last Pain:  Vitals:   10/19/22 1006  TempSrc: Temporal  PainSc: 0-No pain                 Stephanie Coup

## 2022-10-19 NOTE — Op Note (Addendum)
Operative Note    SURGERY DATE: 10/19/2022   PRE-OP DIAGNOSIS:  1. Right knee loose body 2. Right patellofemoral and medial compartment degenerative changes   POST-OP DIAGNOSIS:  1. Right knee loose body 2. Right patellofemoral and medial compartment degenerative changes   PROCEDURES:  1. Right knee arthroscopic loose body removal 2. Right knee arthroscopic chondroplasty of patellofemoral and medial compartments   SURGEON: Rosealee Albee, MD  ASSISTANT: Sonny Dandy, PA    ANESTHESIA: Gen   ESTIMATED BLOOD LOSS: minimal   TOTAL IV FLUIDS: per anesthesia   INDICATION(S):  Kelly Barr is a 46 y.o. female with signs and symptoms as well as MRI finding of loose body within the knee. Patient with mechanical symptoms of catching and locking causing severe pain. She also had degenerative changes to the patellofemoral and medial compartments on preoperative imaging, and she understands she may still have arthritic pain postoperatively. After discussion of risks, benefits, and alternatives to surgery, the patient elected to proceed.   OPERATIVE FINDINGS:    Examination under anesthesia: A careful examination under anesthesia was performed.  Passive range of motion was: Hyperextension: 1.  Extension: 0.  Flexion: 130.  Lachman: normal. Pivot Shift: normal.  Posterior drawer: normal.  Varus stability in full extension: normal.  Varus stability in 30 degrees of flexion: normal.  Valgus stability in full extension: normal.  Valgus stability in 30 degrees of flexion: normal.   Intra-operative findings: A thorough arthroscopic examination of the knee was performed.  The findings are: 1. Suprapatellar pouch: Normal 2. Undersurface of median ridge: Grade 4 degenerative changes 3. Medial patellar facet: Grade 4 degenerative changes 4. Lateral patellar facet: Grade 4 degenerative changes 5. Trochlea: Grade 4 degenerative changes to medial trochlea, normal lateral trochlea 6. Lateral  gutter/popliteus tendon: Normal 7. Hoffa's fat pad: Inflamed 8. Medial gutter/plica: Loose body measuring ~1.4 x 1.3 x 0.4 cm present in medial gutter 9. ACL: Normal 10. PCL: Normal 11. Medial meniscus: Normal 12. Medial compartment cartilage: Diffuse Grade 4 degenerative changes to the medial femoral condyle and tibial plateau 13. Lateral meniscus: Normal 14. Lateral compartment cartilage: Focal Grade 1-2 degenerative changes to the central tibial plateau; normal lateral femoral condyle   OPERATIVE REPORT:     I identified Kelly Barr in the pre-operative holding area. I marked the operative knee with my initials. I reviewed the risks and benefits of the proposed surgical intervention and the patient wished to proceed. The patient was transferred to the operative suite and placed in the supine position with all bony prominences padded.  Anesthesia was administered. Appropriate IV antibiotics were administered prior to incision. The extremity was then prepped and draped in standard fashion. A time out was performed confirming the correct extremity, correct patient, and correct procedure.   Arthroscopy portals were marked. Local anesthetic was injected to the planned portal sites. The anterolateral portal was established with an 11 blade.      The arthroscope was placed in the anterolateral portal and then into the suprapatellar pouch. Next, the medial portal was established under needle localization. A diagnostic knee scope was completed with the above findings. The loose body was identified. The medial portal was lengthened to allow for loose body removal from the joint. An arthroscopic grasper was utilized to remove the loose body measuring ~1.4 x 1.3 x 0.4 cm in its entirety from the joint. This appeared to be an osteochondral fragment.   Next, a chondroplasty was performed of the medial compartment and  patellofemoral compartment such that there were stable cartilage edges without any loose  fragments of cartilage. The arthroscope was placed in the posteromedial and posterolateral aspects of the knee to view these regions. There were no further loose bodies present. Arthroscopic fluid was removed from the joint.   The portals were closed with 3-0 Nylon suture. Sterile dressings included Xeroform, 4x4s, Sof-Rol, and Bias wrap. A Polarcare was placed.  The patient was then awakened and taken to the PACU hemodynamically stable without complication.   Of note, assistance from a PA was essential to performing the surgery.  PA was present for the entire surgery.  PA assisted with patient positioning, retraction, instrumentation, and wound closure. The surgery would have been more difficult and had longer operative time without PA assistance.   POSTOPERATIVE PLAN: The patient will be discharged home today once they meet PACU criteria. Aspirin 325 mg daily was prescribed for 2 weeks for DVT prophylaxis.  Physical therapy will start on POD#3-4. Weight-bearing as tolerated. Follow up in 2 weeks per protocol.

## 2022-10-19 NOTE — Discharge Instructions (Addendum)
Arthroscopic Knee Surgery   Post-Op Instructions   1. Bracing or crutches: Crutches will be provided at the time of discharge from the surgery center if you do not already have them.   2. Ice: You may be provided with a device (Polar Care) that allows you to ice the affected area effectively. Otherwise you can ice manually.    3. Driving:  Plan on not driving for at least one week. Please note that you are advised NOT to drive while taking narcotic pain medications as you may be impaired and unsafe to drive.   4. Activity: Ankle pumps several times an hour while awake to prevent blood clots. Weight bearing: as tolerated. Use crutches for as needed (usually ~1 week or less) until pain allows you to ambulate without a limp. Bending and straightening the knee is unlimited. Elevate knee above heart level as much as possible for one week. Avoid standing more than 5 minutes (consecutively) for the first week.  Avoid long distance travel for 2 weeks.   5. Medications:  - You have been provided a prescription for narcotic pain medicine. After surgery, take 1-2 narcotic tablets every 4 hours if needed for severe pain.  - You may take up to 3000mg/day of tylenol (acetaminophen). You can take 1000mg 3x/day. Please check your narcotic. If you have acetaminophen in your narcotic (each tablet will be 325mg), be careful not to exceed a total of 3000mg/day of acetaminophen.  - A prescription for anti-nausea medication will be provided in case the narcotic medicine causes nausea - take 1 tablet every 6 hours only if nauseated.  - Take ibuprofen 800 mg every 8 hours WITH food to reduce post-operative knee swelling. DO NOT STOP IBUPROFEN POST-OP UNTIL INSTRUCTED TO DO SO at first post-op office visit (10-14 days after surgery). However, please discontinue if you have any abdominal discomfort after taking this.  - Take enteric coated aspirin 325 mg once daily for 2 weeks to prevent blood clots.   6. Bandages: The  physical therapist should change the bandages at the first post-op appointment. If needed, the dressing supplies have been provided to you.   7. Physical Therapy: 1-2 times per week for 6 weeks. Therapy typically starts on post operative Day 3 or 4. You have been provided an order for physical therapy. The therapist will provide home exercises.   8. Work: May return to full work usually around 2 weeks after 1st post-operative visit. May do light duty/desk job in approximately 1-2 weeks when off of narcotics, pain is well-controlled, and swelling has decreased. Labor intensive jobs may require 4-6 weeks to return.      9. Post-Op Appointments: Your first post-op appointment will be with Dr. Patel in approximately 2 weeks time.    If you find that they have not been scheduled please call the Orthopaedic Appointment front desk at [phone].    AMBULATORY SURGERY  DISCHARGE INSTRUCTIONS   The drugs that you were given will stay in your system until tomorrow so for the next 24 hours you should not:  Drive an automobile Make any legal decisions Drink any alcoholic beverage   You may resume regular meals tomorrow.  Today it is better to start with liquids and gradually work up to solid foods.  You may eat anything you prefer, but it is better to start with liquids, then soup and crackers, and gradually work up to solid foods.   Please notify your doctor immediately if you have any unusual bleeding, trouble   breathing, redness and pain at the surgery site, drainage, fever, or pain not relieved by medication.    Additional Instructions:        Please contact your physician with any problems or Same Day Surgery at [phone], Monday through Friday 6 am to 4 pm, or [place] [facility] Main number at [phone].    POLAR CARE INFORMATION  [email or web address]  How to use Breg Polar Care Glacier Cold Therapy System?  YouTube    https://www.youtube.com/watch?v=[email or web address]  OPERATING INSTRUCTIONS  Start the product With dry hands, connect the transformer to the electrical connection located on the top of the cooler. Next, plug the transformer into an appropriate electrical outlet. The unit will automatically start running at this point.  To stop the pump, disconnect electrical power.  Unplug to stop the product when not in use. Unplugging the Polar Care unit turns it off. Always unplug immediately after use. Never leave it plugged in while unattended. Remove pad.    FIRST ADD WATER TO FILL LINE, THEN ICE---Replace ice when existing ice is almost melted  1 Discuss Treatment with your Licensed Health Care Practitioner and Use Only as Prescribed 2 Apply Insulation Barrier & Cold Therapy Pad 3 Check for Moisture 4 Inspect Skin Regularly  Tips and Trouble Shooting Usage Tips 1. Use cubed or chunked ice for optimal performance. 2. It is recommended to drain the Pad between uses. To drain the pad, hold the Pad upright with the hose pointed toward the ground. Depress the black plunger and allow water to drain out. 3. You may disconnect the Pad from the unit without removing the pad from the affected area by depressing the silver tabs on the hose coupling and gently pulling the hoses apart. The Pad and unit will seal itself and will not leak. Note: Some dripping during release is normal. 4. DO NOT RUN PUMP WITHOUT WATER! The pump in this unit is designed to run with water. Running the unit without water will cause permanent damage to the pump. 5. Unplug unit before removing lid.  TROUBLESHOOTING GUIDE Pump not running, Water not flowing to the pad, Pad is not getting cold 1. Make sure the transformer is plugged into the wall outlet. 2. Confirm that the ice and water are filled to the indicated levels. 3. Make sure there are no kinks in the pad. 4. Gently pull on the blue tube to make sure the tube/pad junction is  straight. 5. Remove the pad from the treatment site and ll it while the pad is lying at; then reapply. 6. Confirm that the pad couplings are securely attached to the unit. Listen for the double clicks (Figure 1) to confirm the pad couplings are securely attached.  Leaks    Note: Some condensation on the lines, controller, and pads is unavoidable, especially in warmer climates. 1. If using a Breg Polar Care Cold Therapy unit with a detachable Cold Therapy Pad, and a leak exists (other than condensation on the lines) disconnect the pad couplings. Make sure the silver tabs on the couplings are depressed before reconnecting the pad to the pump hose; then confirm both sides of the coupling are properly clicked in. 2. If the coupling continues to leak or a leak is detected in the pad itself, stop using it and call Breg Customer Care at [phone].  Cleaning After use, empty and dry the unit with a soft cloth. Warm water and mild detergent may be used occasionally to clean the pump and tubes.  WARNING:   The Polar Care Cube can be cold enough to cause serious injury, including full skin necrosis. Follow these Operating Instructions, and carefully read the Product Insert (see pouch on side of unit) and the Cold Therapy Pad Fitting Instructions (provided with each Cold Therapy Pad) prior to use.        

## 2022-10-19 NOTE — Transfer of Care (Signed)
Immediate Anesthesia Transfer of Care Note  Patient: Kelly Barr  Procedure(s) Performed: Right knee arthroscopic loose body removal with chondroplasty of the patellofemoral and medial compartments (Right: Knee)  Patient Location: PACU  Anesthesia Type:General  Level of Consciousness: awake  Airway & Oxygen Therapy: Patient Spontanous Breathing  Post-op Assessment: Report given to RN and Post -op Vital signs reviewed and stable  Post vital signs: Reviewed and stable  Last Vitals:  Vitals Value Taken Time  BP 104/66 10/19/22 0855  Temp 35.9 0855  Pulse 61 10/19/22 0900  Resp 21 10/19/22 0900  SpO2 99 % 10/19/22 0900  Vitals shown include unvalidated device data.  Last Pain:  Vitals:   10/19/22 0628  TempSrc: Temporal  PainSc: 4          Complications: No notable events documented.

## 2022-10-26 ENCOUNTER — Telehealth: Payer: Self-pay | Admitting: Family Medicine

## 2022-10-26 DIAGNOSIS — E039 Hypothyroidism, unspecified: Secondary | ICD-10-CM

## 2022-10-26 DIAGNOSIS — Z13220 Encounter for screening for lipoid disorders: Secondary | ICD-10-CM

## 2022-10-26 DIAGNOSIS — Z131 Encounter for screening for diabetes mellitus: Secondary | ICD-10-CM

## 2022-10-26 DIAGNOSIS — K9589 Other complications of other bariatric procedure: Secondary | ICD-10-CM

## 2022-10-26 DIAGNOSIS — D508 Other iron deficiency anemias: Secondary | ICD-10-CM

## 2022-10-26 NOTE — Telephone Encounter (Signed)
Will need lab orders placed for [facility] collect so we can release them in their sxs. CPE is scheduled 11/18/22

## 2022-10-26 NOTE — Telephone Encounter (Signed)
Called patient to reschedule her CPE labs and she decided to go to [facility].

## 2022-11-01 NOTE — Telephone Encounter (Signed)
Labs released and pt was advised by front desk

## 2022-11-04 ENCOUNTER — Other Ambulatory Visit: Payer: Managed Care, Other (non HMO)

## 2022-11-08 ENCOUNTER — Telehealth: Payer: Self-pay | Admitting: Family Medicine

## 2022-11-08 DIAGNOSIS — K9589 Other complications of other bariatric procedure: Secondary | ICD-10-CM

## 2022-11-08 DIAGNOSIS — Z131 Encounter for screening for diabetes mellitus: Secondary | ICD-10-CM

## 2022-11-08 DIAGNOSIS — Z13220 Encounter for screening for lipoid disorders: Secondary | ICD-10-CM

## 2022-11-08 DIAGNOSIS — E039 Hypothyroidism, unspecified: Secondary | ICD-10-CM

## 2022-11-08 NOTE — Telephone Encounter (Signed)
-----   Message from Ronalee Red, RT sent at 10/25/2022 11:59 AM EST ----- Regarding: Tue 12/5/ lab Patient is scheduled for cpx, please order future labs.  Thanks, Jae Dire

## 2022-11-09 ENCOUNTER — Other Ambulatory Visit: Payer: Managed Care, Other (non HMO)

## 2022-11-11 ENCOUNTER — Encounter: Payer: Managed Care, Other (non HMO) | Admitting: Family Medicine

## 2022-11-12 LAB — LIPID PANEL
Chol/HDL Ratio: 2.8 ratio (ref 0.0–4.4)
Cholesterol, Total: 202 mg/dL — ABNORMAL HIGH (ref 100–199)
HDL: 72 mg/dL (ref >39)
LDL Chol Calc (NIH): 120 mg/dL — ABNORMAL HIGH (ref 0–99)
Triglycerides: 54 mg/dL (ref 0–149)
VLDL Cholesterol Cal: 10 mg/dL (ref 5–40)

## 2022-11-12 LAB — CBC WITH DIFFERENTIAL/PLATELET
Basophils Absolute: 0.1 10*3/uL (ref 0.0–0.2)
Basos: 1 %
EOS (ABSOLUTE): 0.2 10*3/uL (ref 0.0–0.4)
Eos: 3 %
Hematocrit: 41.5 % (ref 34.0–46.6)
Hemoglobin: 13.3 g/dL (ref 11.1–15.9)
Immature Grans (Abs): 0 10*3/uL (ref 0.0–0.1)
Immature Granulocytes: 0 %
Lymphocytes Absolute: 1.3 10*3/uL (ref 0.7–3.1)
Lymphs: 29 %
MCH: 28.8 pg (ref 26.6–33.0)
MCHC: 32 g/dL (ref 31.5–35.7)
MCV: 90 fL (ref 79–97)
Monocytes Absolute: 0.3 10*3/uL (ref 0.1–0.9)
Monocytes: 7 %
Neutrophils Absolute: 2.7 10*3/uL (ref 1.4–7.0)
Neutrophils: 60 %
Platelets: 254 10*3/uL (ref 150–450)
RBC: 4.62 x10E6/uL (ref 3.77–5.28)
RDW: 14.2 % (ref 11.7–15.4)
WBC: 4.5 10*3/uL (ref 3.4–10.8)

## 2022-11-12 LAB — COMPREHENSIVE METABOLIC PANEL
ALT: 18 IU/L (ref 0–32)
AST: 24 IU/L (ref 0–40)
Albumin/Globulin Ratio: 1.7 (ref 1.2–2.2)
Albumin: 4.1 g/dL (ref 3.9–4.9)
Alkaline Phosphatase: 65 IU/L (ref 44–121)
BUN/Creatinine Ratio: 14 (ref 9–23)
BUN: 10 mg/dL (ref 6–24)
Bilirubin Total: 0.6 mg/dL (ref 0.0–1.2)
CO2: 22 mmol/L (ref 20–29)
Calcium: 9.1 mg/dL (ref 8.7–10.2)
Chloride: 106 mmol/L (ref 96–106)
Creatinine, Ser: 0.73 mg/dL (ref 0.57–1.00)
Globulin, Total: 2.4 g/dL (ref 1.5–4.5)
Glucose: 83 mg/dL (ref 70–99)
Potassium: 4.9 mmol/L (ref 3.5–5.2)
Sodium: 140 mmol/L (ref 134–144)
Total Protein: 6.5 g/dL (ref 6.0–8.5)
eGFR: 103 mL/min/{1.73_m2} (ref >59)

## 2022-11-12 LAB — HEMOGLOBIN A1C
Est. average glucose Bld gHb Est-mCnc: 103 mg/dL
Hgb A1c MFr Bld: 5.2 % (ref 4.8–5.6)

## 2022-11-12 LAB — TSH: TSH: 2.6 u[IU]/mL (ref 0.450–4.500)

## 2022-11-12 LAB — T4, FREE: Free T4: 1.29 ng/dL (ref 0.82–1.77)

## 2022-11-12 NOTE — Progress Notes (Signed)
No critical labs need to be addressed urgently. We will discuss labs in detail at upcoming office visit.   

## 2022-11-18 ENCOUNTER — Ambulatory Visit (INDEPENDENT_AMBULATORY_CARE_PROVIDER_SITE_OTHER): Payer: Managed Care, Other (non HMO) | Admitting: Family Medicine

## 2022-11-18 ENCOUNTER — Encounter: Payer: Self-pay | Admitting: Family Medicine

## 2022-11-18 VITALS — BP 124/72 | HR 70 | Temp 97.5°F | Ht 66.25 in | Wt 348.6 lb

## 2022-11-18 DIAGNOSIS — Z0000 Encounter for general adult medical examination without abnormal findings: Secondary | ICD-10-CM | POA: Diagnosis not present

## 2022-11-18 DIAGNOSIS — Z1211 Encounter for screening for malignant neoplasm of colon: Secondary | ICD-10-CM

## 2022-11-18 DIAGNOSIS — E039 Hypothyroidism, unspecified: Secondary | ICD-10-CM | POA: Diagnosis not present

## 2022-11-18 DIAGNOSIS — K9589 Other complications of other bariatric procedure: Secondary | ICD-10-CM | POA: Diagnosis not present

## 2022-11-18 DIAGNOSIS — D508 Other iron deficiency anemias: Secondary | ICD-10-CM

## 2022-11-18 MED ORDER — LEVOTHYROXINE SODIUM 100 MCG PO TABS: 100.0000 ug | ORAL_TABLET | Freq: Every day | ORAL | 3 refills | Status: DC

## 2022-11-18 NOTE — Progress Notes (Signed)
Patient ID: Kelly Barr, female    DOB: December 09, 1975, 46 y.o.   MRN: [id number]  This visit was conducted in person.  BP 124/72   Pulse 70   Temp (!) 97.5 F (36.4 C) (Temporal)   Ht 5' 6.25" (1.683 m)   Wt (!) 348 lb 9.6 oz (158.1 kg)   LMP 11/02/2022   SpO2 99%   BMI 55.84 kg/m    CC:  Chief Complaint  Patient presents with   Annual Exam    Subjective:   HPI: Kelly Barr is a 46 y.o. female presenting on 11/18/2022 for Annual Exam   Recent knee surgery for OA  Hypothyroid  levo 100 mcg daily Lab Results  Component Value Date   TSH 2.600 11/11/2022    Wt Readings from Last 3 Encounters:  11/18/22 (!) 348 lb 9.6 oz (158.1 kg)  10/19/22 (!) 350 lb (158.8 kg)  10/14/22 (!) 350 lb (158.8 kg)    Iron def anemia , resolved with iron infusions.  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  Elevated Cholesterol:  Lab Results  Component Value Date   CHOL 202 (H) 11/11/2022   HDL 72 11/11/2022   LDLCALC 120 (H) 11/11/2022   TRIG 54 11/11/2022   CHOLHDL 2.8 11/11/2022  The 10-year ASCVD risk score (Arnett DK, et al., 2019) is: 0.5%   Values used to calculate the score:     Age: 81 years     Sex: Female     Is Non-Hispanic African American: No     Diabetic: No     Tobacco smoker: No     Systolic Blood Pressure: 629 mmHg     Is BP treated: No     HDL Cholesterol: 72 mg/dL     Total Cholesterol: 202 mg/dL Using medications without problems: Muscle aches:  Diet compliance: Exercise: Other complaints:      Relevant past medical, surgical, family and social history reviewed and updated as indicated. Interim medical history since our last visit reviewed. Allergies and medications reviewed and updated. Outpatient Medications Prior to Visit  Medication Sig Dispense Refill   acetaminophen (TYLENOL) 500 MG tablet Take 2 tablets (1,000 mg total) by mouth every 8 (eight) hours. 90 tablet 2   Cyanocobalamin  (VITAMIN B-12 SL) Place 1 tablet under the tongue daily.     ibuprofen (ADVIL) 800 MG tablet Take 800 mg by mouth every 8 (eight) hours as needed.     levothyroxine (SYNTHROID) 100 MCG tablet Take 100 mcg by mouth daily.     HYDROcodone-acetaminophen (NORCO) 5-325 MG tablet Take 1-2 tablets by mouth every 4 (four) hours as needed for moderate pain or severe pain. 10 tablet 0   ondansetron (ZOFRAN-ODT) 4 MG disintegrating tablet Take 1 tablet (4 mg total) by mouth every 8 (eight) hours as needed for nausea or vomiting. 20 tablet 0   No facility-administered medications prior to visit.     Per HPI unless specifically indicated in ROS section below Review of Systems  Constitutional:  Negative for fatigue and fever.  HENT:  Negative for congestion.   Eyes:  Negative for pain.  Respiratory:  Negative for cough and shortness of breath.   Cardiovascular:  Negative for chest pain, palpitations and leg swelling.  Gastrointestinal:  Negative for abdominal pain.  Genitourinary:  Negative for dysuria and vaginal bleeding.  Musculoskeletal:  Negative for back pain.  Neurological:  Negative for syncope, light-headedness  and headaches.  Psychiatric/Behavioral:  Negative for dysphoric mood.    Objective:  BP 124/72   Pulse 70   Temp (!) 97.5 F (36.4 C) (Temporal)   Ht 5' 6.25" (1.683 m)   Wt (!) 348 lb 9.6 oz (158.1 kg)   LMP 11/02/2022   SpO2 99%   BMI 55.84 kg/m   Wt Readings from Last 3 Encounters:  11/18/22 (!) 348 lb 9.6 oz (158.1 kg)  10/19/22 (!) 350 lb (158.8 kg)  10/14/22 (!) 350 lb (158.8 kg)      Physical Exam Vitals and nursing note reviewed.  Constitutional:      General: She is not in acute distress.    Appearance: Normal appearance. She is well-developed. She is obese. She is not ill-appearing or toxic-appearing.  HENT:     Head: Normocephalic.     Right Ear: Hearing, tympanic membrane, ear canal and external ear normal.     Left Ear: Hearing, tympanic membrane, ear  canal and external ear normal.     Nose: Nose normal.  Eyes:     General: Lids are normal. Lids are everted, no foreign bodies appreciated.     Conjunctiva/sclera: Conjunctivae normal.     Pupils: Pupils are equal, round, and reactive to light.  Neck:     Thyroid: No thyroid mass or thyromegaly.     Vascular: No carotid bruit.     Trachea: Trachea normal.  Cardiovascular:     Rate and Rhythm: Normal rate and regular rhythm.     Heart sounds: Normal heart sounds, S1 normal and S2 normal. No murmur heard.    No gallop.  Pulmonary:     Effort: Pulmonary effort is normal. No respiratory distress.     Breath sounds: Normal breath sounds. No wheezing, rhonchi or rales.  Abdominal:     General: Bowel sounds are normal. There is no distension or abdominal bruit.     Palpations: Abdomen is soft. There is no fluid wave or mass.     Tenderness: There is no abdominal tenderness. There is no guarding or rebound.     Hernia: No hernia is present.  Musculoskeletal:     Cervical back: Normal range of motion and neck supple.  Lymphadenopathy:     Cervical: No cervical adenopathy.  Skin:    General: Skin is warm and dry.     Findings: No rash.  Neurological:     Mental Status: She is alert.     Cranial Nerves: No cranial nerve deficit.     Sensory: No sensory deficit.  Psychiatric:        Mood and Affect: Mood is not anxious or depressed.        Speech: Speech normal.        Behavior: Behavior normal. Behavior is cooperative.        Judgment: Judgment normal.       Results for orders placed or performed in visit on 10/26/22  CBC with Differential/Platelet  Result Value Ref Range   WBC 4.5 3.4 - 10.8 x10E3/uL   RBC 4.62 3.77 - 5.28 x10E6/uL   Hemoglobin 13.3 11.1 - 15.9 g/dL   Hematocrit 41.5 34.0 - 46.6 %   MCV 90 79 - 97 fL   MCH 28.8 26.6 - 33.0 pg   MCHC 32.0 31.5 - 35.7 g/dL   RDW 14.2 11.7 - 15.4 %   Platelets 254 150 - 450 x10E3/uL   Neutrophils 60 Not Estab. %   Lymphs 29  Not Estab. %  Monocytes 7 Not Estab. %   Eos 3 Not Estab. %   Basos 1 Not Estab. %   Neutrophils Absolute 2.7 1.4 - 7.0 x10E3/uL   Lymphocytes Absolute 1.3 0.7 - 3.1 x10E3/uL   Monocytes Absolute 0.3 0.1 - 0.9 x10E3/uL   EOS (ABSOLUTE) 0.2 0.0 - 0.4 x10E3/uL   Basophils Absolute 0.1 0.0 - 0.2 x10E3/uL   Immature Granulocytes 0 Not Estab. %   Immature Grans (Abs) 0.0 0.0 - 0.1 x10E3/uL  T4, free  Result Value Ref Range   Free T4 1.29 0.82 - 1.77 ng/dL  Hemoglobin A1c  Result Value Ref Range   Hgb A1c MFr Bld 5.2 4.8 - 5.6 %   Est. average glucose Bld gHb Est-mCnc 103 mg/dL  TSH  Result Value Ref Range   TSH 2.600 0.450 - 4.500 uIU/mL  Comprehensive metabolic panel  Result Value Ref Range   Glucose 83 70 - 99 mg/dL   BUN 10 6 - 24 mg/dL   Creatinine, Ser 0.73 0.57 - 1.00 mg/dL   eGFR 103 >59 mL/min/1.73   BUN/Creatinine Ratio 14 9 - 23   Sodium 140 134 - 144 mmol/L   Potassium 4.9 3.5 - 5.2 mmol/L   Chloride 106 96 - 106 mmol/L   CO2 22 20 - 29 mmol/L   Calcium 9.1 8.7 - 10.2 mg/dL   Total Protein 6.5 6.0 - 8.5 g/dL   Albumin 4.1 3.9 - 4.9 g/dL   Globulin, Total 2.4 1.5 - 4.5 g/dL   Albumin/Globulin Ratio 1.7 1.2 - 2.2   Bilirubin Total 0.6 0.0 - 1.2 mg/dL   Alkaline Phosphatase 65 44 - 121 IU/L   AST 24 0 - 40 IU/L   ALT 18 0 - 32 IU/L  Lipid panel  Result Value Ref Range   Cholesterol, Total 202 (H) 100 - 199 mg/dL   Triglycerides 54 0 - 149 mg/dL   HDL 72 >39 mg/dL   VLDL Cholesterol Cal 10 5 - 40 mg/dL   LDL Chol Calc (NIH) 120 (H) 0 - 99 mg/dL   Chol/HDL Ratio 2.8 0.0 - 4.4 ratio     COVID 19 screen:  No recent travel or known exposure to COVID19 The patient denies respiratory symptoms of COVID 19 at this time. The importance of social distancing was discussed today.   Assessment and Plan The patient's preventative maintenance and recommended screening tests for an annual wellness exam were reviewed in full today. Brought up to date unless services  declined.  Counselled on the importance of diet, exercise, and its role in overall health and mortality. The patient's FH and SH was reviewed, including their home life, tobacco status, and drug and alcohol status.   HIV:  done  mammogram:  01/2020  Colon cancer screening: due  Vaccines: flu uptodate, uptodate Td... will set up COVID PAP:  Per GYN  at [facility] in 01/2022.  Problem List Items Addressed This Visit     Hypothyroidism   Relevant Medications   levothyroxine (SYNTHROID) 100 MCG tablet   Iron deficiency anemia following bariatric surgery   Other Visit Diagnoses     Routine general medical examination at a health care facility    -  Primary   Colon cancer screening       Relevant Orders   Ambulatory referral to Gastroenterology       Eliezer Lofts, MD

## 2022-11-18 NOTE — Patient Instructions (Addendum)
Look into coverage with insurance for [facility].  We will set up referral for colonoscopy.  Please call the location of your choice from the menu below to schedule your Mammogram and/or Bone Density appointment.    [facility]   [facility]                      Phone:  [phone] [address]                               [place], [place] [address]                                                             Services: Traditional and 3D Mammogram, Bone Density   [facility]                 Phone: [phone] [address]                                                       [place], [place] [address]    Service: Bone Density ONLY   *this site does NOT perform mammograms  [facility] Mamm[facility]                        Phone:  [phone] [address]                                  [place], [place] [address]                                            Services:  3D Mammogram and Bone Density    [facility]   Phone:  [phone]   [address]                                                                            [place], [place] [address]                                            Services: 3D Mammogram and Bone [facility])  Phone:  [phone]   [address]                        [place], [place] [address]  Services:  3D Mammogram and Bone Density  

## 2023-02-04 ENCOUNTER — Ambulatory Visit: Payer: Managed Care, Other (non HMO) | Admitting: Internal Medicine

## 2023-02-04 ENCOUNTER — Other Ambulatory Visit: Payer: Managed Care, Other (non HMO)

## 2023-02-04 ENCOUNTER — Ambulatory Visit: Payer: Managed Care, Other (non HMO)

## 2023-05-12 ENCOUNTER — Telehealth: Payer: Self-pay | Admitting: Internal Medicine

## 2023-05-12 ENCOUNTER — Other Ambulatory Visit: Payer: Self-pay | Admitting: Internal Medicine

## 2023-05-12 NOTE — Progress Notes (Signed)
[facility]- labs- cbc/cmp; iron studies; ferritin; B12 levels  Schedule- Follow up 1 week later- MD: possible venofer-   GB

## 2023-05-12 NOTE — Telephone Encounter (Signed)
This patient left a voicemail to schedule an appointment with Dr. Suzette Battiest infusion. She needs a [facility] slip to get labs prior to seeing him. Please advise on scheduling

## 2023-05-16 ENCOUNTER — Encounter: Payer: Self-pay | Admitting: Internal Medicine

## 2023-05-16 NOTE — Telephone Encounter (Signed)
Pt states she will call us when she is ready to have labs drawn again and have the [facility] form faxed over in about 4 months.

## 2023-05-18 ENCOUNTER — Inpatient Hospital Stay: Payer: Managed Care, Other (non HMO) | Attending: Internal Medicine

## 2023-05-18 VITALS — BP 115/67 | HR 55 | Temp 99.3°F | Resp 18

## 2023-05-18 DIAGNOSIS — D509 Iron deficiency anemia, unspecified: Secondary | ICD-10-CM | POA: Insufficient documentation

## 2023-05-18 DIAGNOSIS — K9589 Other complications of other bariatric procedure: Secondary | ICD-10-CM

## 2023-05-18 MED ORDER — SODIUM CHLORIDE 0.9 % IV SOLN
200.0000 mg | Freq: Once | INTRAVENOUS | Status: AC
  Administered 2023-05-18: 200 mg via INTRAVENOUS
  Filled 2023-05-18: qty 200

## 2023-05-18 MED ORDER — SODIUM CHLORIDE 0.9 % IV SOLN
Freq: Once | INTRAVENOUS | Status: AC
  Filled 2023-05-18: qty 250

## 2023-05-18 NOTE — Progress Notes (Signed)
Pt has been educated and understands. Pt declined to stay 30 mins after iron infusion. VSS.  

## 2023-05-24 ENCOUNTER — Encounter: Payer: Self-pay | Admitting: Family Medicine

## 2023-05-24 MED ORDER — SEMAGLUTIDE-WEIGHT MANAGEMENT 0.25 MG/0.5ML ~~LOC~~ SOAJ: 0.2500 mg | SUBCUTANEOUS | 0 refills | Status: DC

## 2023-05-24 NOTE — Telephone Encounter (Signed)
Please let the patient know I sent the prescription to [facility] but was not sure which 1.  Please verify and it is okay to send it to the the other [facility] if it needed.

## 2023-05-25 ENCOUNTER — Inpatient Hospital Stay: Payer: Managed Care, Other (non HMO)

## 2023-05-25 VITALS — BP 132/78 | HR 78 | Temp 98.1°F | Resp 17

## 2023-05-25 DIAGNOSIS — K9589 Other complications of other bariatric procedure: Secondary | ICD-10-CM

## 2023-05-25 DIAGNOSIS — D509 Iron deficiency anemia, unspecified: Secondary | ICD-10-CM | POA: Diagnosis not present

## 2023-05-25 MED ORDER — SODIUM CHLORIDE 0.9 % IV SOLN
Freq: Once | INTRAVENOUS | Status: AC
  Filled 2023-05-25: qty 250

## 2023-05-25 MED ORDER — SODIUM CHLORIDE 0.9 % IV SOLN
200.0000 mg | Freq: Once | INTRAVENOUS | Status: AC
  Administered 2023-05-25: 200 mg via INTRAVENOUS
  Filled 2023-05-25: qty 200

## 2023-05-25 NOTE — Patient Instructions (Signed)
[facility] [place] [facility]  Discharge Instructions: Thank you for choosing [facility] to provide your oncology and hematology care.  If you have a lab appointment with the Cancer Center, please go directly to the Cancer Center and check in at the registration area.  Wear comfortable clothing and clothing appropriate for easy access to any Portacath or PICC line.   We strive to give you quality time with your provider. You may need to reschedule your appointment if you arrive late (15 or more minutes).  Arriving late affects you and other patients whose appointments are after yours.  Also, if you miss three or more appointments without notifying the office, you may be dismissed from the clinic at the provider's discretion.      For prescription refill requests, have your pharmacy contact our office and allow 72 hours for refills to be completed.    Today you received the following chemotherapy and/or immunotherapy agents Venofer.      To help prevent nausea and vomiting after your treatment, we encourage you to take your nausea medication as directed.  BELOW ARE SYMPTOMS THAT SHOULD BE REPORTED IMMEDIATELY: *FEVER GREATER THAN 100.4 F (38 C) OR HIGHER *CHILLS OR SWEATING *NAUSEA AND VOMITING THAT IS NOT CONTROLLED WITH YOUR NAUSEA MEDICATION *UNUSUAL SHORTNESS OF BREATH *UNUSUAL BRUISING OR BLEEDING *URINARY PROBLEMS (pain or burning when urinating, or frequent urination) *BOWEL PROBLEMS (unusual diarrhea, constipation, pain near the anus) TENDERNESS IN MOUTH AND THROAT WITH OR WITHOUT PRESENCE OF ULCERS (sore throat, sores in mouth, or a toothache) UNUSUAL RASH, SWELLING OR PAIN  UNUSUAL VAGINAL DISCHARGE OR ITCHING   Items with * indicate a potential emergency and should be followed up as soon as possible or go to the Emergency Department if any problems should occur.  Please show the CHEMOTHERAPY ALERT CARD or IMMUNOTHERAPY ALERT CARD at check-in to  the Emergency Department and triage nurse.  Should you have questions after your visit or need to cancel or reschedule your appointment, please contact [facility]  [phone] and follow the prompts.  Office hours are 8:00 a.m. to 4:30 p.m. Monday - Friday. Please note that voicemails left after 4:00 p.m. may not be returned until the following business day.  We are closed weekends and major holidays. You have access to a nurse at all times for urgent questions. Please call the main number to the clinic [phone] and follow the prompts.  For any non-urgent questions, you may also contact your provider using MyChart. We now offer e-Visits for anyone 18 and older to request care online for non-urgent symptoms. For details visit [email or web address].   Also download the MyChart app! Go to the app store, search "MyChart", open the app, select [facility], and log in with your MyChart username and password.   

## 2023-06-01 ENCOUNTER — Other Ambulatory Visit (HOSPITAL_COMMUNITY): Payer: Self-pay

## 2023-06-01 ENCOUNTER — Inpatient Hospital Stay: Payer: Managed Care, Other (non HMO)

## 2023-06-01 ENCOUNTER — Telehealth: Payer: Self-pay

## 2023-06-01 VITALS — BP 122/77 | HR 72 | Temp 98.9°F | Resp 18

## 2023-06-01 DIAGNOSIS — D509 Iron deficiency anemia, unspecified: Secondary | ICD-10-CM | POA: Diagnosis not present

## 2023-06-01 DIAGNOSIS — D508 Other iron deficiency anemias: Secondary | ICD-10-CM

## 2023-06-01 MED ORDER — SODIUM CHLORIDE 0.9 % IV SOLN
200.0000 mg | Freq: Once | INTRAVENOUS | Status: AC
  Administered 2023-06-01: 200 mg via INTRAVENOUS
  Filled 2023-06-01: qty 200

## 2023-06-01 MED ORDER — SODIUM CHLORIDE 0.9 % IV SOLN
Freq: Once | INTRAVENOUS | Status: AC
  Filled 2023-06-01: qty 250

## 2023-06-01 NOTE — Telephone Encounter (Signed)
Patient Advocate Encounter   Received notification from [employer] that prior authorization is required for [facility] 0.25MG /0.5ML auto-injectors   Submitted: 06-01-2023 Key [id number]  Status is pending

## 2023-06-02 NOTE — Telephone Encounter (Signed)
Zetha notified of approval via MyChart.

## 2023-06-02 NOTE — Telephone Encounter (Signed)
Patient Advocate Encounter  Prior Authorization for [employer] 0.25MG /0.5ML auto-injectors has been approved through [employer].    Kelly Barr  Effective: 06-01-2023 to 01-01-2024

## 2023-06-15 ENCOUNTER — Inpatient Hospital Stay: Payer: Managed Care, Other (non HMO) | Attending: Internal Medicine

## 2023-06-15 VITALS — BP 115/74 | HR 76 | Temp 97.8°F | Resp 16

## 2023-06-15 DIAGNOSIS — D508 Other iron deficiency anemias: Secondary | ICD-10-CM

## 2023-06-15 DIAGNOSIS — D509 Iron deficiency anemia, unspecified: Secondary | ICD-10-CM | POA: Diagnosis present

## 2023-06-15 MED ORDER — SODIUM CHLORIDE 0.9 % IV SOLN
200.0000 mg | Freq: Once | INTRAVENOUS | Status: AC
  Administered 2023-06-15: 200 mg via INTRAVENOUS
  Filled 2023-06-15: qty 200

## 2023-06-15 MED ORDER — SODIUM CHLORIDE 0.9 % IV SOLN
Freq: Once | INTRAVENOUS | Status: AC
  Filled 2023-06-15: qty 250

## 2023-06-15 NOTE — Progress Notes (Signed)
Pt tolerated treatment without complaints.  VSS.  Pt refused 30 minute post observation.  Pt understands risks.   

## 2023-06-15 NOTE — Patient Instructions (Signed)
[facility]  Discharge Instructions: Thank you for choosing [facility] to provide your oncology and hematology care.  If you have a lab appointment with the Cancer Center, please go directly to the Cancer Center and check in at the registration area.  Wear comfortable clothing and clothing appropriate for easy access to any Portacath or PICC line.   We strive to give you quality time with your provider. You may need to reschedule your appointment if you arrive late (15 or more minutes).  Arriving late affects you and other patients whose appointments are after yours.  Also, if you miss three or more appointments without notifying the office, you may be dismissed from the clinic at the provider's discretion.      For prescription refill requests, have your pharmacy contact our office and allow 72 hours for refills to be completed.    Today you received the following chemotherapy and/or immunotherapy agents Venofer.      To help prevent nausea and vomiting after your treatment, we encourage you to take your nausea medication as directed.  BELOW ARE SYMPTOMS THAT SHOULD BE REPORTED IMMEDIATELY: *FEVER GREATER THAN 100.4 F (38 C) OR HIGHER *CHILLS OR SWEATING *NAUSEA AND VOMITING THAT IS NOT CONTROLLED WITH YOUR NAUSEA MEDICATION *UNUSUAL SHORTNESS OF BREATH *UNUSUAL BRUISING OR BLEEDING *URINARY PROBLEMS (pain or burning when urinating, or frequent urination) *BOWEL PROBLEMS (unusual diarrhea, constipation, pain near the anus) TENDERNESS IN MOUTH AND THROAT WITH OR WITHOUT PRESENCE OF ULCERS (sore throat, sores in mouth, or a toothache) UNUSUAL RASH, SWELLING OR PAIN  UNUSUAL VAGINAL DISCHARGE OR ITCHING   Items with * indicate a potential emergency and should be followed up as soon as possible or go to the Emergency Department if any problems should occur.  Please show the CHEMOTHERAPY ALERT CARD or IMMUNOTHERAPY ALERT CARD at check-in to  the Emergency Department and triage nurse.  Should you have questions after your visit or need to cancel or reschedule your appointment, please contact [facility]  [phone] and follow the prompts.  Office hours are 8:00 a.m. to 4:30 p.m. Monday - Friday. Please note that voicemails left after 4:00 p.m. may not be returned until the following business day.  We are closed weekends and major holidays. You have access to a nurse at all times for urgent questions. Please call the main number to the clinic [phone] and follow the prompts.  For any non-urgent questions, you may also contact your provider using MyChart. We now offer e-Visits for anyone 18 and older to request care online for non-urgent symptoms. For details visit [email or web address].   Also download the MyChart app! Go to the app store, search "MyChart", open the app, select [facility], and log in with your MyChart username and password.   

## 2023-09-08 ENCOUNTER — Telehealth: Payer: Self-pay | Admitting: *Deleted

## 2023-09-08 NOTE — Telephone Encounter (Signed)
Patient called requesting an order be faxed to [facility] for her to get her labs drawn tomorrow to have available for her appointment with Dr B on the 9th

## 2023-09-09 ENCOUNTER — Encounter: Payer: Self-pay | Admitting: Internal Medicine

## 2023-09-14 ENCOUNTER — Inpatient Hospital Stay: Payer: Managed Care, Other (non HMO) | Attending: Internal Medicine | Admitting: Internal Medicine

## 2023-09-14 ENCOUNTER — Inpatient Hospital Stay: Payer: Managed Care, Other (non HMO)

## 2023-09-14 ENCOUNTER — Encounter: Payer: Self-pay | Admitting: Internal Medicine

## 2023-09-14 VITALS — BP 112/73 | HR 60 | Resp 20

## 2023-09-14 DIAGNOSIS — D508 Other iron deficiency anemias: Secondary | ICD-10-CM

## 2023-09-14 DIAGNOSIS — Z23 Encounter for immunization: Secondary | ICD-10-CM | POA: Insufficient documentation

## 2023-09-14 DIAGNOSIS — K9589 Other complications of other bariatric procedure: Secondary | ICD-10-CM

## 2023-09-14 DIAGNOSIS — D509 Iron deficiency anemia, unspecified: Secondary | ICD-10-CM | POA: Insufficient documentation

## 2023-09-14 DIAGNOSIS — E039 Hypothyroidism, unspecified: Secondary | ICD-10-CM | POA: Insufficient documentation

## 2023-09-14 MED ORDER — SODIUM CHLORIDE 0.9 % IV SOLN
200.0000 mg | Freq: Once | INTRAVENOUS | Status: AC
  Administered 2023-09-14: 200 mg via INTRAVENOUS
  Filled 2023-09-14: qty 200

## 2023-09-14 MED ORDER — SODIUM CHLORIDE 0.9 % IV SOLN
Freq: Once | INTRAVENOUS | Status: AC
  Filled 2023-09-14: qty 250

## 2023-09-14 MED ORDER — INFLUENZA VIRUS VACC SPLIT PF (FLUZONE) 0.5 ML IM SUSY
0.5000 mL | PREFILLED_SYRINGE | Freq: Once | INTRAMUSCULAR | Status: AC
  Administered 2023-09-14: 0.5 mL via INTRAMUSCULAR
  Filled 2023-09-14: qty 0.5

## 2023-09-14 NOTE — Progress Notes (Signed)
Pt has been educated and understands. Pt declined to stay 30 mins after iron infusion. VSS.

## 2023-09-14 NOTE — Progress Notes (Signed)
Fatigue/weakness: yes Dyspena: no Light headedness: no Blood in stool: no  Premier 1 per day.

## 2023-09-14 NOTE — Progress Notes (Signed)
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  Patient last venofer infusion was in July.  Complains of ongoing fatigue.  Patient denies any blood in stools or black or stools.  Denies any nausea vomiting abdominal pain.  Review of Systems  Constitutional:  Positive for malaise/fatigue. Negative for chills, diaphoresis, fever and weight loss.  HENT:  Negative for nosebleeds and sore throat.   Eyes:  Negative for double vision.  Respiratory:  Negative for cough, hemoptysis, sputum production, shortness of breath and wheezing.   Cardiovascular:  Negative for chest pain, palpitations, orthopnea and leg swelling.  Gastrointestinal:  Negative for abdominal pain, blood in stool, constipation, diarrhea, heartburn, melena, nausea and vomiting.  Genitourinary:  Negative for dysuria, frequency and urgency.  Musculoskeletal:  Negative for back pain and joint pain.  Neurological:  Negative for dizziness, tingling, focal weakness, weakness and headaches.  Endo/Heme/Allergies:  Does not bruise/bleed easily.  Psychiatric/Behavioral:  Negative for depression. The patient is not nervous/anxious and does not have insomnia.      PAST MEDICAL HISTORY :  Past Medical History:  Diagnosis Date   Allergy    Asthma    seasonal   Depression    History of 2019 novel coronavirus disease (COVID-19) 08/17/2022   Hypothyroidism    Iron deficiency anemia    PONV (postoperative nausea and vomiting)    HARD TIME WAKING AFTER ENDOSCOPY   Postprandial  hypoglycemia     PAST SURGICAL HISTORY :   Past Surgical History:  Procedure Laterality Date   CESAREAN SECTION     x2   CHONDROPLASTY Left 12/23/2017   Procedure: CHONDROPLASTY;  Surgeon: Signa Kell, MD;  Location: [facility];  Service: Orthopedics;  Laterality: Left;   GASTRIC BYPASS     KNEE ARTHROSCOPY Left 12/23/2017   Procedure: ARTHROSCOPY KNEE WITH LOOSE BODY REMOVAL;  Surgeon: Signa Kell, MD;  Location: [facility];  Service: Orthopedics;  Laterality: Left;   KNEE ARTHROSCOPY WITH SUBCHONDROPLASTY Right 10/19/2022   Procedure: Right knee arthroscopic loose body removal with chondroplasty of the patellofemoral and medial compartments;  Surgeon: Signa Kell, MD;  Location: [facility];  Service: Orthopedics;  Laterality: Right;    FAMILY HISTORY :   Family History  Problem Relation Age of Onset   Arthritis Mother    Cervical cancer Mother    Alcoholism Paternal Grandfather    Stomach cancer Paternal Grandfather    Heart disease Maternal Grandfather    Diabetes Maternal Grandmother    Breast cancer Neg Hx     SOCIAL HISTORY:   Social History   Tobacco Use   Smoking status: Never   Smokeless tobacco: Never  Vaping Use   Vaping status: Never Used  Substance Use Topics   Alcohol use: No    Comment: occ   Drug use: No    ALLERGIES:  is allergic to morphine and codeine.  MEDICATIONS:  Current Outpatient Medications  Medication Sig Dispense Refill   acetaminophen (TYLENOL) 500 MG tablet Take 2 tablets (1,000 mg total) by mouth every 8 (eight) hours. 90 tablet 2   Cyanocobalamin (VITAMIN B-12 SL)  Place 1 tablet under the tongue daily.     ibuprofen (ADVIL) 800 MG tablet Take 800 mg by mouth every 8 (eight) hours as needed.     levothyroxine (SYNTHROID) 100 MCG tablet Take 1 tablet (100 mcg total) by mouth daily. 90 tablet 3   No current facility-administered medications for this visit.    PHYSICAL EXAMINATION: ECOG PERFORMANCE STATUS: 1 - Symptomatic but  completely ambulatory  BP (!) 125/58 (BP Location: Left Arm, Patient Position: Sitting, Cuff Size: Large)   Pulse 78   Temp 98.9 F (37.2 C) (Tympanic)   Ht 5' 6.25" (1.683 m)   Wt (!) 350 lb (158.8 kg)   SpO2 99%   BMI 56.07 kg/m   Filed Weights   09/14/23 1452  Weight: (!) 350 lb (158.8 kg)    Physical Exam HENT:     Head: Normocephalic and atraumatic.     Mouth/Throat:     Pharynx: No oropharyngeal exudate.  Eyes:     Pupils: Pupils are equal, round, and reactive to light.  Cardiovascular:     Rate and Rhythm: Normal rate and regular rhythm.  Pulmonary:     Effort: No respiratory distress.     Breath sounds: No wheezing.  Abdominal:     General: Bowel sounds are normal. There is no distension.     Palpations: Abdomen is soft. There is no mass.     Tenderness: There is no abdominal tenderness. There is no guarding or rebound.  Musculoskeletal:        General: No tenderness. Normal range of motion.     Cervical back: Normal range of motion and neck supple.  Skin:    General: Skin is warm.  Neurological:     Mental Status: She is alert and oriented to person, place, and time.  Psychiatric:        Mood and Affect: Affect normal.      LABORATORY DATA:  I have reviewed the data as listed    Component Value Date/Time   NA 140 11/11/2022 0827   NA 137 04/25/2014 1534   K 4.9 11/11/2022 0827   K 4.7 04/25/2014 1534   CL 106 11/11/2022 0827   CL 106 04/25/2014 1534   CO2 22 11/11/2022 0827   CO2 22 04/25/2014 1534   GLUCOSE 83 11/11/2022 0827   GLUCOSE 90 04/25/2014 1534   BUN 10 11/11/2022 0827   BUN 11 04/25/2014 1534   CREATININE 0.73 11/11/2022 0827   CREATININE 0.67 04/25/2014 1534   CALCIUM 9.1 11/11/2022 0827   CALCIUM 8.1 (L) 04/25/2014 1534   PROT 6.5 11/11/2022 0827   ALBUMIN 4.1 11/11/2022 0827   AST 24 11/11/2022 0827   ALT 18 11/11/2022 0827   ALKPHOS 65 11/11/2022 0827   BILITOT 0.6 11/11/2022 0827   GFRNONAA 115 12/21/2019 1625    GFRNONAA >60 04/25/2014 1534   GFRAA 132 12/21/2019 1625   GFRAA >60 04/25/2014 1534    No results found for: "SPEP", "UPEP"  Lab Results  Component Value Date   WBC 4.5 11/11/2022   NEUTROABS 2.7 11/11/2022   HGB 13.3 11/11/2022   HCT 41.5 11/11/2022   MCV 90 11/11/2022   PLT 254 11/11/2022      Chemistry      Component Value Date/Time   NA 140 11/11/2022 0827   NA 137 04/25/2014 1534   K 4.9 11/11/2022 0827   K 4.7 04/25/2014 1534   CL 106 11/11/2022 0827   CL 106 04/25/2014 1534  CO2 22 11/11/2022 0827   CO2 22 04/25/2014 1534   BUN 10 11/11/2022 0827   BUN 11 04/25/2014 1534   CREATININE 0.73 11/11/2022 0827   CREATININE 0.67 04/25/2014 1534   GLU 129 07/25/2019 0000      Component Value Date/Time   CALCIUM 9.1 11/11/2022 0827   CALCIUM 8.1 (L) 04/25/2014 1534   ALKPHOS 65 11/11/2022 0827   AST 24 11/11/2022 0827   ALT 18 11/11/2022 0827   BILITOT 0.6 11/11/2022 0827         ASSESSMENT & PLAN:   Iron deficiency anemia following bariatric surgery # Iron Deficiency 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  however iron saturation 10%; Ferritin 12   #As patient is symptomatic proceed with IV venofer; also recommend 2 more infusions.  # B12 def- Sublingual B12/ vit D- STABLE;  # DISPOSITION:  # venofer today # venofer in 2 weeks # follow up in 4 months- MD [1 week prior [facility]-cbc/ferritin/iron studies/b12]- possible venofer-Dr.B     Earna Coder, MD 09/14/2023 3:24 PM

## 2023-09-14 NOTE — Assessment & Plan Note (Addendum)
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  however iron saturation 10%; Ferritin 12   #As patient is symptomatic proceed with IV venofer; also recommend 2 more infusions.  # B12 def- Sublingual B12/ vit D- STABLE;  # DISPOSITION:  # venofer today # venofer in 2 weeks # follow up in 4 months- MD [1 week prior [facility]-cb[facility]/ferritin/iron studies/b12]- possible venofer-Dr.B

## 2023-09-29 ENCOUNTER — Inpatient Hospital Stay: Payer: Managed Care, Other (non HMO)

## 2023-09-29 VITALS — BP 121/74 | HR 81 | Temp 98.1°F

## 2023-09-29 DIAGNOSIS — K9589 Other complications of other bariatric procedure: Secondary | ICD-10-CM

## 2023-09-29 DIAGNOSIS — D509 Iron deficiency anemia, unspecified: Secondary | ICD-10-CM | POA: Diagnosis not present

## 2023-09-29 MED ORDER — SODIUM CHLORIDE 0.9% FLUSH
10.0000 mL | Freq: Once | INTRAVENOUS | Status: AC | PRN
  Administered 2023-09-29: 10 mL
  Filled 2023-09-29: qty 10

## 2023-09-29 MED ORDER — IRON SUCROSE 20 MG/ML IV SOLN
200.0000 mg | Freq: Once | INTRAVENOUS | Status: AC
Start: 2023-09-29 — End: 2023-09-29
  Administered 2023-09-29: 200 mg via INTRAVENOUS
  Filled 2023-09-29: qty 10

## 2023-09-29 MED ORDER — SODIUM CHLORIDE 0.9% FLUSH
3.0000 mL | Freq: Once | INTRAVENOUS | Status: AC | PRN
  Administered 2023-09-29: 3 mL
  Filled 2023-09-29: qty 3

## 2023-09-29 NOTE — Progress Notes (Signed)
Patient declined to wait the 30 minutes for post iron infusion observation today. Tolerated infusion well. VSS. 

## 2024-01-11 ENCOUNTER — Encounter: Payer: Self-pay | Admitting: Internal Medicine

## 2024-01-16 ENCOUNTER — Inpatient Hospital Stay: Payer: Managed Care, Other (non HMO) | Attending: Internal Medicine | Admitting: Internal Medicine

## 2024-01-16 ENCOUNTER — Inpatient Hospital Stay: Payer: Managed Care, Other (non HMO)

## 2024-01-16 ENCOUNTER — Encounter: Payer: Self-pay | Admitting: Internal Medicine

## 2024-01-16 VITALS — BP 117/75 | HR 76 | Temp 99.2°F | Resp 18 | Ht 66.25 in | Wt 361.0 lb

## 2024-01-16 DIAGNOSIS — K9589 Other complications of other bariatric procedure: Secondary | ICD-10-CM

## 2024-01-16 DIAGNOSIS — D509 Iron deficiency anemia, unspecified: Secondary | ICD-10-CM | POA: Insufficient documentation

## 2024-01-16 DIAGNOSIS — Z9884 Bariatric surgery status: Secondary | ICD-10-CM | POA: Insufficient documentation

## 2024-01-16 DIAGNOSIS — D508 Other iron deficiency anemias: Secondary | ICD-10-CM

## 2024-01-16 DIAGNOSIS — E538 Deficiency of other specified B group vitamins: Secondary | ICD-10-CM | POA: Insufficient documentation

## 2024-01-16 MED ORDER — IRON SUCROSE 20 MG/ML IV SOLN
200.0000 mg | Freq: Once | INTRAVENOUS | Status: AC
Start: 2024-01-16 — End: 2024-01-16
  Administered 2024-01-16: 200 mg via INTRAVENOUS
  Filled 2024-01-16: qty 10

## 2024-01-16 NOTE — Progress Notes (Signed)
  [facility] Center OFFICE PROGRESS NOTE  Patient Care Team: Judithann Novas, MD as PCP - General Gwyn Leos, MD as Consulting Physician (Oncology)   SUMMARY OF ONCOLOGIC HISTORY:  # 2006- IRON  DEFICIENCY ANEMIA sec Malabsorption/ gastric bypass, Roux-en-Y; IV Ferrahem every 6 months-maintenance; sublingual B12  #Hypothyroidism  INTERVAL HISTORY:  48 year-old female patient with a history of gastric bypass/malabsorption is currently IV Iron  every 4- 6 months is here for follow-up.  Patient is having shortness of breath, and is always tired. Her anxiety has been up more. She is still struggling with her weight.  When she lies down at night, it is like the world starts spinning, ongoing for a few weeks, more consistent.  She hasn't had a menstrual cycle since December of 2024. Patient denies any blood in stools or black or stools.  Denies any nausea vomiting abdominal pain.  Review of Systems  Constitutional:  Positive for malaise/fatigue. Negative for chills, diaphoresis, fever and weight loss.  HENT:  Negative for nosebleeds and sore throat.   Eyes:  Negative for double vision.  Respiratory:  Negative for cough, hemoptysis, sputum production, shortness of breath and wheezing.   Cardiovascular:  Negative for chest pain, palpitations, orthopnea and leg swelling.  Gastrointestinal:  Negative for abdominal pain, blood in stool, constipation, diarrhea, heartburn, melena, nausea and vomiting.  Genitourinary:  Negative for dysuria, frequency and urgency.  Musculoskeletal:  Negative for back pain and joint pain.  Neurological:  Negative for dizziness, tingling, focal weakness, weakness and headaches.  Endo/Heme/Allergies:  Does not bruise/bleed easily.  Psychiatric/Behavioral:  Negative for depression. The patient is not nervous/anxious and does not have insomnia.      PAST MEDICAL HISTORY :  Past Medical History:  Diagnosis Date   Allergy    Asthma    seasonal    Depression    History of 2019 novel coronavirus disease (COVID-19) 08/17/2022   Hypothyroidism    Iron  deficiency anemia    PONV (postoperative nausea and vomiting)    HARD TIME WAKING AFTER ENDOSCOPY   Postprandial hypoglycemia     PAST SURGICAL HISTORY :   Past Surgical History:  Procedure Laterality Date   CESAREAN SECTION     x2   CHONDROPLASTY Left 12/23/2017   Procedure: CHONDROPLASTY;  Surgeon: Lorri Rota, MD;  Location: [facility];  Service: Orthopedics;  Laterality: Left;   GASTRIC BYPASS     KNEE ARTHROSCOPY Left 12/23/2017   Procedure: ARTHROSCOPY KNEE WITH LOOSE BODY REMOVAL;  Surgeon: Lorri Rota, MD;  Location: [facility];  Service: Orthopedics;  Laterality: Left;   KNEE ARTHROSCOPY WITH SUBCHONDROPLASTY Right 10/19/2022   Procedure: Right knee arthroscopic loose body removal with chondroplasty of the patellofemoral and medial compartments;  Surgeon: Lorri Rota, MD;  Location: [facility];  Service: Orthopedics;  Laterality: Right;    FAMILY HISTORY :   Family History  Problem Relation Age of Onset   Arthritis Mother    Cervical cancer Mother    Alcoholism Paternal Grandfather    Stomach cancer Paternal Grandfather    Heart disease Maternal Grandfather    Diabetes Maternal Grandmother    Breast cancer Neg Hx     SOCIAL HISTORY:   Social History   Tobacco Use   Smoking status: Never   Smokeless tobacco: Never  Vaping Use   Vaping status: Never Used  Substance Use Topics   Alcohol use: No    Comment: occ   Drug use: No    ALLERGIES:  is allergic  to morphine and codeine.  MEDICATIONS:  Current Outpatient Medications  Medication Sig Dispense Refill   Cyanocobalamin (VITAMIN B-12 SL) Place 1 tablet under the tongue daily.     ibuprofen  (ADVIL ) 800 MG tablet Take 800 mg by mouth every 8 (eight) hours as needed.     levothyroxine  (SYNTHROID ) 100 MCG tablet Take 1 tablet (100 mcg total) by mouth daily. 90 tablet 3   No current facility-administered  medications for this visit.    PHYSICAL EXAMINATION: ECOG PERFORMANCE STATUS: 1 - Symptomatic but completely ambulatory  BP 117/75 (BP Location: Left Arm, Patient Position: Sitting, Cuff Size: Large)   Pulse 76   Temp 99.2 F (37.3 C) (Tympanic)   Resp 18   Ht 5' 6.25" (1.683 m)   Wt (!) 361 lb (163.7 kg)   SpO2 100%   BMI 57.83 kg/m   Filed Weights   01/16/24 1504  Weight: (!) 361 lb (163.7 kg)     Physical Exam HENT:     Head: Normocephalic and atraumatic.     Mouth/Throat:     Pharynx: No oropharyngeal exudate.  Eyes:     Pupils: Pupils are equal, round, and reactive to light.  Cardiovascular:     Rate and Rhythm: Normal rate and regular rhythm.  Pulmonary:     Effort: No respiratory distress.     Breath sounds: No wheezing.  Abdominal:     General: Bowel sounds are normal. There is no distension.     Palpations: Abdomen is soft. There is no mass.     Tenderness: There is no abdominal tenderness. There is no guarding or rebound.  Musculoskeletal:        General: No tenderness. Normal range of motion.     Cervical back: Normal range of motion and neck supple.  Skin:    General: Skin is warm.  Neurological:     Mental Status: She is alert and oriented to person, place, and time.  Psychiatric:        Mood and Affect: Affect normal.      LABORATORY DATA:  I have reviewed the data as listed    Component Value Date/Time   NA 140 11/11/2022 0827   NA 137 04/25/2014 1534   K 4.9 11/11/2022 0827   K 4.7 04/25/2014 1534   CL 106 11/11/2022 0827   CL 106 04/25/2014 1534   CO2 22 11/11/2022 0827   CO2 22 04/25/2014 1534   GLUCOSE 83 11/11/2022 0827   GLUCOSE 90 04/25/2014 1534   BUN 10 11/11/2022 0827   BUN 11 04/25/2014 1534   CREATININE 0.73 11/11/2022 0827   CREATININE 0.67 04/25/2014 1534   CALCIUM 9.1 11/11/2022 0827   CALCIUM 8.1 (L) 04/25/2014 1534   PROT 6.5 11/11/2022 0827   ALBUMIN 4.1 11/11/2022 0827   AST 24 11/11/2022 0827   ALT 18  11/11/2022 0827   ALKPHOS 65 11/11/2022 0827   BILITOT 0.6 11/11/2022 0827   GFRNONAA 115 12/21/2019 1625   GFRNONAA >60 04/25/2014 1534   GFRAA 132 12/21/2019 1625   GFRAA >60 04/25/2014 1534    No results found for: "SPEP", "UPEP"  Lab Results  Component Value Date   WBC 4.5 11/11/2022   NEUTROABS 2.7 11/11/2022   HGB 13.3 11/11/2022   HCT 41.5 11/11/2022   MCV 90 11/11/2022   PLT 254 11/11/2022      Chemistry      Component Value Date/Time   NA 140 11/11/2022 0827   NA 137 04/25/2014 1534  K 4.9 11/11/2022 0827   K 4.7 04/25/2014 1534   CL 106 11/11/2022 0827   CL 106 04/25/2014 1534   CO2 22 11/11/2022 0827   CO2 22 04/25/2014 1534   BUN 10 11/11/2022 0827   BUN 11 04/25/2014 1534   CREATININE 0.73 11/11/2022 0827   CREATININE 0.67 04/25/2014 1534   GLU 129 07/25/2019 0000      Component Value Date/Time   CALCIUM 9.1 11/11/2022 0827   CALCIUM 8.1 (L) 04/25/2014 1534   ALKPHOS 65 11/11/2022 0827   AST 24 11/11/2022 0827   ALT 18 11/11/2022 0827   BILITOT 0.6 11/11/2022 0827         ASSESSMENT & PLAN:   Iron  deficiency anemia following bariatric surgery # 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  however iron  saturation 10%; Ferritin 12   #As patient is symptomatic proceed with IV venofer ; also recommend 2 more infusions.  # B12 def- Sublingual B12/ vit D- STABLE;  # DISPOSITION:  # venofer  today # venofer  in 2 weeks # follow up in 4 months- MD [1 week prior [facility]/ferritin/iron  studies/b12]- possible venofer -Dr.B     Gwyn Leos, MD 01/16/2024 3:46 PM

## 2024-01-16 NOTE — Assessment & Plan Note (Addendum)
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  however iron  saturation 12%; Ferritin 14. Recommend barimelt+ iron  [over-the-counter medication/can buy online].  2 a day- under the tongue. These pills need to dissolve under the tongue/and should be absorbed into your bloodstream.      #As patient is symptomatic proceed with IV venofer ; also recommend 2 more infusions.  # B12 def- Sublingual B12/ vit D- STABLE;  # DISPOSITION:  # venofer  today # venofer  in 2 weeks # follow up in 4 months- MD [1 week prior [facility]/ferritin/iron  studies/b12]- possible venofer -Dr.B

## 2024-01-16 NOTE — Progress Notes (Signed)
 Patient is having shortness of breath, and is always tired. Her anxiety has been up more. She is still struggling with her weight.  When she lies down at night, it is like the world starts spinning, ongoing for a few weeks, more consistent.  She hasn't had a menstrual cycle since December of 2024.

## 2024-01-16 NOTE — Patient Instructions (Signed)
Recommend barimelt+ iron [over-the-counter medication/can buy online].  2 a day- under the tongue. These pills need to dissolve under the tongue/and should be absorbed into your bloodstream.

## 2024-01-30 ENCOUNTER — Inpatient Hospital Stay: Payer: Managed Care, Other (non HMO)

## 2024-01-30 VITALS — BP 93/55 | HR 69 | Temp 99.0°F | Resp 16

## 2024-01-30 DIAGNOSIS — D508 Other iron deficiency anemias: Secondary | ICD-10-CM

## 2024-01-30 DIAGNOSIS — D509 Iron deficiency anemia, unspecified: Secondary | ICD-10-CM | POA: Diagnosis not present

## 2024-01-30 MED ORDER — IRON SUCROSE 20 MG/ML IV SOLN
200.0000 mg | Freq: Once | INTRAVENOUS | Status: AC
  Administered 2024-01-30: 200 mg via INTRAVENOUS
  Filled 2024-01-30: qty 10

## 2024-01-30 MED ORDER — SODIUM CHLORIDE 0.9% FLUSH
10.0000 mL | Freq: Once | INTRAVENOUS | Status: AC | PRN
  Administered 2024-01-30: 10 mL
  Filled 2024-01-30: qty 10

## 2024-01-30 NOTE — Progress Notes (Signed)
 Declined post-observation. Aware of risks. Vitals stable at discharge.

## 2024-01-30 NOTE — Patient Instructions (Signed)

## 2024-04-23 ENCOUNTER — Other Ambulatory Visit: Payer: Self-pay | Admitting: Family Medicine

## 2024-04-23 DIAGNOSIS — Z1231 Encounter for screening mammogram for malignant neoplasm of breast: Secondary | ICD-10-CM

## 2024-05-08 ENCOUNTER — Ambulatory Visit
Admission: RE | Admit: 2024-05-08 | Discharge: 2024-05-08 | Disposition: A | Discharge status: Home / Self Care | Source: Ambulatory Visit | Attending: Family Medicine | Admitting: Family Medicine

## 2024-05-08 DIAGNOSIS — Z1231 Encounter for screening mammogram for malignant neoplasm of breast: Secondary | ICD-10-CM | POA: Insufficient documentation

## 2024-05-09 ENCOUNTER — Ambulatory Visit: Admitting: Family Medicine

## 2024-05-09 ENCOUNTER — Encounter: Payer: Self-pay | Admitting: Family Medicine

## 2024-05-09 VITALS — BP 121/78 | HR 73 | Temp 99.4°F | Ht 66.25 in | Wt 355.4 lb

## 2024-05-09 DIAGNOSIS — R051 Acute cough: Secondary | ICD-10-CM | POA: Insufficient documentation

## 2024-05-09 MED ORDER — ALBUTEROL SULFATE HFA 108 (90 BASE) MCG/ACT IN AERS: 2.0000 | INHALATION_SPRAY | Freq: Four times a day (QID) | RESPIRATORY_TRACT | 2 refills | Status: AC | PRN

## 2024-05-09 NOTE — Progress Notes (Signed)
 Patient ID: Kelly Barr, female    DOB: 11-10-1976, 48 y.o.   MRN: [id number]  This visit was conducted in person.  BP 121/78   Pulse 73   Temp 99.4 F (37.4 C) (Oral)   Ht 5' 6.25" (1.683 m)   Wt (!) 355 lb 6.4 oz (161.2 kg)   SpO2 99%   BMI 56.93 kg/m    CC:  Chief Complaint  Patient presents with   Nasal Congestion    Started on Friday. Nasal has been running clear but she feels its more in her chest. Lack of energy    Cough    Hearts to cough and to breath in    Subjective:   HPI: Kelly Barr is a 48 y.o. female presenting on 05/09/2024 for Nasal Congestion (Started on Friday. Nasal has been running clear but she feels its more in her chest. Lack of energy ) and Cough (Hearts to cough and to breath in)   Date of onset: 6 days Initial symptoms included  nasal congestion, no ST Symptoms progressed to chest congestion.  Clear nasal discharge.  productive cough No fever.  She is SOB, wheeze and fatigue.  Lots of sneeze, no  itchy eyes but itchy ears.   Leak at work, replacing roof at work.   Sick contacts:  none COVID testing:   negative x 1      She has tried to treat OTC cold and flu med, mucinex.      Has history of mild intermittent asthma with colds. Non-smoker.       Relevant past medical, surgical, family and social history reviewed and updated as indicated. Interim medical history since our last visit reviewed. Allergies and medications reviewed and updated. Outpatient Medications Prior to Visit  Medication Sig Dispense Refill   Cyanocobalamin (VITAMIN B-12 SL) Place 1 tablet under the tongue daily.     ibuprofen  (ADVIL ) 800 MG tablet Take 800 mg by mouth every 8 (eight) hours as needed.     levothyroxine  (SYNTHROID ) 100 MCG tablet Take 1 tablet (100 mcg total) by mouth daily. 90 tablet 3   No facility-administered medications prior to visit.     Per HPI unless specifically indicated in ROS section below Review of Systems   Constitutional:  Positive for fatigue. Negative for fever.  HENT:  Positive for congestion and sneezing. Negative for sinus pain and sore throat.   Eyes:  Negative for pain.  Respiratory:  Positive for cough and shortness of breath. Negative for wheezing.   Cardiovascular:  Negative for chest pain, palpitations and leg swelling.  Gastrointestinal:  Negative for abdominal pain.  Genitourinary:  Negative for dysuria and vaginal bleeding.  Musculoskeletal:  Negative for back pain.  Neurological:  Negative for syncope, light-headedness and headaches.  Psychiatric/Behavioral:  Negative for dysphoric mood.    Objective:  BP 121/78   Pulse 73   Temp 99.4 F (37.4 C) (Oral)   Ht 5' 6.25" (1.683 m)   Wt (!) 355 lb 6.4 oz (161.2 kg)   SpO2 99%   BMI 56.93 kg/m   Wt Readings from Last 3 Encounters:  05/09/24 (!) 355 lb 6.4 oz (161.2 kg)  01/16/24 (!) 361 lb (163.7 kg)  09/14/23 (!) 350 lb (158.8 kg)      Physical Exam Constitutional:      General: She is not in acute distress.    Appearance: She is well-developed. She is not ill-appearing or toxic-appearing.  HENT:     Head:  Normocephalic.     Right Ear: Hearing, tympanic membrane, ear canal and external ear normal. Tympanic membrane is not erythematous, retracted or bulging.     Left Ear: Hearing, tympanic membrane, ear canal and external ear normal. Tympanic membrane is not erythematous, retracted or bulging.     Nose: Mucosal edema and rhinorrhea present.     Right Sinus: No maxillary sinus tenderness or frontal sinus tenderness.     Left Sinus: No maxillary sinus tenderness or frontal sinus tenderness.     Mouth/Throat:     Pharynx: Uvula midline.  Eyes:     General: Lids are normal. Lids are everted, no foreign bodies appreciated.     Conjunctiva/sclera: Conjunctivae normal.     Pupils: Pupils are equal, round, and reactive to light.  Neck:     Thyroid: No thyroid mass or thyromegaly.     Vascular: No carotid bruit.      Trachea: Trachea normal.  Cardiovascular:     Rate and Rhythm: Normal rate and regular rhythm.     Pulses: Normal pulses.     Heart sounds: Normal heart sounds, S1 normal and S2 normal. No murmur heard.    No friction rub. No gallop.  Pulmonary:     Effort: Pulmonary effort is normal. No tachypnea or respiratory distress.     Breath sounds: Normal breath sounds. No decreased breath sounds, wheezing, rhonchi or rales.  Musculoskeletal:     Cervical back: Normal range of motion and neck supple.  Skin:    General: Skin is warm and dry.     Findings: No rash.  Neurological:     Mental Status: She is alert.  Psychiatric:        Mood and Affect: Mood is not anxious or depressed.        Speech: Speech normal.        Behavior: Behavior normal. Behavior is cooperative.        Judgment: Judgment normal.       Results for orders placed or performed in visit on 10/26/22  CBC with Differential/Platelet   Collection Time: 11/11/22  8:27 AM  Result Value Ref Range   WBC 4.5 3.4 - 10.8 x10E3/uL   RBC 4.62 3.77 - 5.28 x10E6/uL   Hemoglobin 13.3 11.1 - 15.9 g/dL   Hematocrit 16.1 09.6 - 46.6 %   MCV 90 79 - 97 fL   MCH 28.8 26.6 - 33.0 pg   MCHC 32.0 31.5 - 35.7 g/dL   RDW 04.5 40.9 - 81.1 %   Platelets 254 150 - 450 x10E3/uL   Neutrophils 60 Not Estab. %   Lymphs 29 Not Estab. %   Monocytes 7 Not Estab. %   Eos 3 Not Estab. %   Basos 1 Not Estab. %   Neutrophils Absolute 2.7 1.4 - 7.0 x10E3/uL   Lymphocytes Absolute 1.3 0.7 - 3.1 x10E3/uL   Monocytes Absolute 0.3 0.1 - 0.9 x10E3/uL   EOS (ABSOLUTE) 0.2 0.0 - 0.4 x10E3/uL   Basophils Absolute 0.1 0.0 - 0.2 x10E3/uL   Immature Granulocytes 0 Not Estab. %   Immature Grans (Abs) 0.0 0.0 - 0.1 x10E3/uL  T4, free   Collection Time: 11/11/22  8:27 AM  Result Value Ref Range   Free T4 1.29 0.82 - 1.77 ng/dL  Hemoglobin B1Y   Collection Time: 11/11/22  8:27 AM  Result Value Ref Range   Hgb A1c MFr Bld 5.2 4.8 - 5.6 %   Est. average  glucose Bld gHb Est-mCnc  103 mg/dL  TSH   Collection Time: 11/11/22  8:27 AM  Result Value Ref Range   TSH 2.600 0.450 - 4.500 uIU/mL  Comprehensive metabolic panel   Collection Time: 11/11/22  8:27 AM  Result Value Ref Range   Glucose 83 70 - 99 mg/dL   BUN 10 6 - 24 mg/dL   Creatinine, Ser 0.98 0.57 - 1.00 mg/dL   eGFR 119 >14 NW/GNF/6.21   BUN/Creatinine Ratio 14 9 - 23   Sodium 140 134 - 144 mmol/L   Potassium 4.9 3.5 - 5.2 mmol/L   Chloride 106 96 - 106 mmol/L   CO2 22 20 - 29 mmol/L   Calcium 9.1 8.7 - 10.2 mg/dL   Total Protein 6.5 6.0 - 8.5 g/dL   Albumin 4.1 3.9 - 4.9 g/dL   Globulin, Total 2.4 1.5 - 4.5 g/dL   Albumin/Globulin Ratio 1.7 1.2 - 2.2   Bilirubin Total 0.6 0.0 - 1.2 mg/dL   Alkaline Phosphatase 65 44 - 121 IU/L   AST 24 0 - 40 IU/L   ALT 18 0 - 32 IU/L  Lipid panel   Collection Time: 11/11/22  8:27 AM  Result Value Ref Range   Cholesterol, Total 202 (H) 100 - 199 mg/dL   Triglycerides 54 0 - 149 mg/dL   HDL 72 >30 mg/dL   VLDL Cholesterol Cal 10 5 - 40 mg/dL   LDL Chol Calc (NIH) 865 (H) 0 - 99 mg/dL   Chol/HDL Ratio 2.8 0.0 - 4.4 ratio    Assessment and Plan  Acute cough Assessment & Plan: Acute,  Most likely viral or allergic.  Possible beginning of asthma flare but lungs clear today.  No sign of ongoing bacterial infection.   Treat with Mucinex Dm, albuterol prn coughing fits. Also start zyrtec and Flonase for possible allergy cause.   Return and ER precautions provided.   Other orders -     Albuterol Sulfate HFA; Inhale 2 puffs into the lungs every 6 (six) hours as needed for wheezing or shortness of breath.  Dispense: 8 g; Refill: 2    No follow-ups on file.   Herby Lolling, MD

## 2024-05-09 NOTE — Patient Instructions (Addendum)
 Flonase 2 spray per nostril daily.  Start zyrtec at night.  Mucinex DM twice daily.

## 2024-05-09 NOTE — Assessment & Plan Note (Addendum)
 Acute,  Most likely viral or allergic.  Possible beginning of asthma flare but lungs clear today.  No sign of ongoing bacterial infection.   Treat with Mucinex Dm, albuterol prn coughing fits. Also start zyrtec and Flonase for possible allergy cause.   Return and ER precautions provided.

## 2024-05-15 ENCOUNTER — Ambulatory Visit: Payer: Self-pay | Admitting: Family Medicine

## 2024-05-15 ENCOUNTER — Inpatient Hospital Stay: Payer: Managed Care, Other (non HMO)

## 2024-05-15 ENCOUNTER — Inpatient Hospital Stay: Payer: Managed Care, Other (non HMO) | Admitting: Internal Medicine

## 2024-05-16 ENCOUNTER — Encounter: Payer: Self-pay | Admitting: Family Medicine

## 2024-05-18 ENCOUNTER — Telehealth: Payer: Self-pay

## 2024-05-18 MED ORDER — WEGOVY 0.25 MG/0.5ML ~~LOC~~ SOAJ: 0.2500 mg | SUBCUTANEOUS | 0 refills | Status: DC

## 2024-05-18 NOTE — Telephone Encounter (Signed)
 Pharmacy Patient Advocate Encounter   Received notification from CoverMyMeds that prior authorization for Wegovy  0.25MG /0.5ML auto-injectors is required/requested.   Insurance verification completed.   The patient is insured through [facility] .   Per test claim: PA required; PA submitted to above mentioned insurance via CoverMyMeds Key/confirmation #/EOC [id number] Status is pending

## 2024-05-22 ENCOUNTER — Encounter: Payer: Self-pay | Admitting: Internal Medicine

## 2024-05-23 ENCOUNTER — Inpatient Hospital Stay: Attending: Internal Medicine | Admitting: Internal Medicine

## 2024-05-23 ENCOUNTER — Inpatient Hospital Stay

## 2024-05-23 ENCOUNTER — Encounter: Payer: Self-pay | Admitting: Internal Medicine

## 2024-05-23 VITALS — BP 117/67 | HR 66 | Temp 97.9°F | Resp 18

## 2024-05-23 DIAGNOSIS — K9589 Other complications of other bariatric procedure: Secondary | ICD-10-CM

## 2024-05-23 DIAGNOSIS — D509 Iron deficiency anemia, unspecified: Secondary | ICD-10-CM | POA: Insufficient documentation

## 2024-05-23 DIAGNOSIS — D508 Other iron deficiency anemias: Secondary | ICD-10-CM | POA: Diagnosis not present

## 2024-05-23 DIAGNOSIS — E538 Deficiency of other specified B group vitamins: Secondary | ICD-10-CM | POA: Diagnosis not present

## 2024-05-23 MED ORDER — CYANOCOBALAMIN 1000 MCG/ML IJ SOLN
1000.0000 ug | Freq: Once | INTRAMUSCULAR | Status: AC
  Administered 2024-05-23: 1000 ug via INTRAMUSCULAR

## 2024-05-23 MED ORDER — IRON SUCROSE 20 MG/ML IV SOLN
200.0000 mg | Freq: Once | INTRAVENOUS | Status: AC
  Administered 2024-05-23: 200 mg via INTRAVENOUS

## 2024-05-23 NOTE — Progress Notes (Signed)
 Fatigue/weakness: YES Dyspena: NO Light headedness: OCCASIONALLY  Blood in stool: NO   Mammogram 05/14/24.

## 2024-05-23 NOTE — Progress Notes (Signed)
 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  Patient currently on barimelt. Over all doing well.  Complains of mild fatigue otherwise no shortness of breath or chest pain.  No blood in stools.   Review of Systems  Constitutional:  Positive for malaise/fatigue. Negative for chills, diaphoresis, fever and weight loss.  HENT:  Negative for nosebleeds and sore throat.   Eyes:  Negative for double vision.  Respiratory:  Negative for cough, hemoptysis, sputum production, shortness of breath and wheezing.   Cardiovascular:  Negative for chest pain, palpitations, orthopnea and leg swelling.  Gastrointestinal:  Negative for abdominal pain, blood in stool, constipation, diarrhea, heartburn, melena, nausea and vomiting.  Genitourinary:  Negative for dysuria, frequency and urgency.  Musculoskeletal:  Negative for back pain and joint pain.  Neurological:  Negative for dizziness, tingling, focal weakness, weakness and headaches.  Endo/Heme/Allergies:  Does not bruise/bleed easily.  Psychiatric/Behavioral:  Negative for depression. The patient is not nervous/anxious and does not have insomnia.      PAST MEDICAL HISTORY :  Past Medical History:  Diagnosis Date   Allergy    Asthma    seasonal   Depression    History of 2019 novel coronavirus disease (COVID-19) 08/17/2022   Hypothyroidism    Iron  deficiency anemia    PONV (postoperative nausea and vomiting)    HARD TIME WAKING AFTER ENDOSCOPY   Postprandial hypoglycemia     PAST SURGICAL  HISTORY :   Past Surgical History:  Procedure Laterality Date   CESAREAN SECTION     x2   CHONDROPLASTY Left 12/23/2017   Procedure: CHONDROPLASTY;  Surgeon: Lorri Rota, MD;  Location: [facility] ORS;  Service: Orthopedics;  Laterality: Left;   GASTRIC BYPASS     KNEE ARTHROSCOPY Left 12/23/2017   Procedure: ARTHROSCOPY KNEE WITH LOOSE BODY REMOVAL;  Surgeon: Lorri Rota, MD;  Location: [facility] ORS;  Service: Orthopedics;  Laterality: Left;   KNEE ARTHROSCOPY WITH SUBCHONDROPLASTY Right 10/19/2022   Procedure: Right knee arthroscopic loose body removal with chondroplasty of the patellofemoral and medial compartments;  Surgeon: Lorri Rota, MD;  Location: [facility] ORS;  Service: Orthopedics;  Laterality: Right;    FAMILY HISTORY :   Family History  Problem Relation Age of Onset   Arthritis Mother    Cervical cancer Mother    Alcoholism Paternal Grandfather    Stomach cancer Paternal Grandfather    Heart disease Maternal Grandfather    Diabetes Maternal Grandmother    Breast cancer Neg Hx     SOCIAL HISTORY:   Social History   Tobacco Use   Smoking status: Never   Smokeless tobacco: Never  Vaping Use   Vaping status: Never Used  Substance Use Topics   Alcohol use: No    Comment: occ   Drug use: No    ALLERGIES:  is allergic to morphine and codeine.  MEDICATIONS:  Current Outpatient Medications  Medication Sig Dispense Refill   albuterol  (VENTOLIN  HFA) 108 (90 Base) MCG/ACT inhaler Inhale 2 puffs into the lungs every 6 (six) hours as needed for wheezing or shortness of breath. 8 g 2  Multiple Vitamins-Minerals (MULTIVITAL PO) Take 2 tablets by mouth at bedtime.     Semaglutide -Weight Management (WEGOVY ) 0.25 MG/0.5ML SOAJ Inject 0.25 mg into the skin once a week. 6 mL 0   No current facility-administered medications for this visit.    PHYSICAL EXAMINATION: ECOG PERFORMANCE STATUS: 1 - Symptomatic but completely ambulatory  BP 125/79 (BP Location: Left Arm, Patient  Position: Sitting, Cuff Size: Large)   Pulse 79   Temp 98.5 F (36.9 C) (Tympanic)   Resp 16   Ht 5' 6.25 (1.683 m)   Wt (!) 359 lb 8 oz (163.1 kg)   SpO2 99%   BMI 57.59 kg/m   Filed Weights   05/23/24 1509  Weight: (!) 359 lb 8 oz (163.1 kg)     Physical Exam HENT:     Head: Normocephalic and atraumatic.     Mouth/Throat:     Pharynx: No oropharyngeal exudate.   Eyes:     Pupils: Pupils are equal, round, and reactive to light.    Cardiovascular:     Rate and Rhythm: Normal rate and regular rhythm.  Pulmonary:     Effort: No respiratory distress.     Breath sounds: No wheezing.  Abdominal:     General: Bowel sounds are normal. There is no distension.     Palpations: Abdomen is soft. There is no mass.     Tenderness: There is no abdominal tenderness. There is no guarding or rebound.   Musculoskeletal:        General: No tenderness. Normal range of motion.     Cervical back: Normal range of motion and neck supple.   Skin:    General: Skin is warm.   Neurological:     Mental Status: She is alert and oriented to person, place, and time.   Psychiatric:        Mood and Affect: Affect normal.      LABORATORY DATA:  I have reviewed the data as listed    Component Value Date/Time   NA 140 11/11/2022 0827   NA 137 04/25/2014 1534   K 4.9 11/11/2022 0827   K 4.7 04/25/2014 1534   CL 106 11/11/2022 0827   CL 106 04/25/2014 1534   CO2 22 11/11/2022 0827   CO2 22 04/25/2014 1534   GLUCOSE 83 11/11/2022 0827   GLUCOSE 90 04/25/2014 1534   BUN 10 11/11/2022 0827   BUN 11 04/25/2014 1534   CREATININE 0.73 11/11/2022 0827   CREATININE 0.67 04/25/2014 1534   CALCIUM 9.1 11/11/2022 0827   CALCIUM 8.1 (L) 04/25/2014 1534   PROT 6.5 11/11/2022 0827   ALBUMIN 4.1 11/11/2022 0827   AST 24 11/11/2022 0827   ALT 18 11/11/2022 0827   ALKPHOS 65 11/11/2022 0827   BILITOT 0.6 11/11/2022 0827   GFRNONAA 115 12/21/2019 1625   GFRNONAA >60 04/25/2014 1534   GFRAA  132 12/21/2019 1625   GFRAA >60 04/25/2014 1534    No results found for: SPEP, UPEP  Lab Results  Component Value Date   WBC 4.5 11/11/2022   NEUTROABS 2.7 11/11/2022   HGB 13.3 11/11/2022   HCT 41.5 11/11/2022   MCV 90 11/11/2022   PLT 254 11/11/2022      Chemistry      Component Value Date/Time   NA 140 11/11/2022 0827   NA 137 04/25/2014 1534   K 4.9 11/11/2022 0827   K 4.7 04/25/2014 1534   CL 106 11/11/2022 0827   CL 106 04/25/2014 1534  CO2 22 11/11/2022 0827   CO2 22 04/25/2014 1534   BUN 10 11/11/2022 0827   BUN 11 04/25/2014 1534   CREATININE 0.73 11/11/2022 0827   CREATININE 0.67 04/25/2014 1534   GLU 129 07/25/2019 0000      Component Value Date/Time   CALCIUM 9.1 11/11/2022 0827   CALCIUM 8.1 (L) 04/25/2014 1534   ALKPHOS 65 11/11/2022 0827   AST 24 11/11/2022 0827   ALT 18 11/11/2022 0827   BILITOT 0.6 11/11/2022 0827         ASSESSMENT & PLAN:   Iron  deficiency anemia following bariatric surgery # 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   barimelt+ iron    # however iron  saturation 20%; Ferritin 45. Proceed with venfoer today; add b12 injection.   # B12 def- Sublingual B12/ vit D-  add b12 injection.   # DISPOSITION:  # venofer  today; ADD b12 injection today- # follow up in 6 months- MD [1 week prior [facility]-cbc/ferritin/iron  studies/b12]- possible venofer ;   b12 injection  Dr.B     Gwyn Leos, MD 05/23/2024 3:58 PM

## 2024-05-23 NOTE — Assessment & Plan Note (Addendum)
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   barimelt+ iron    # however iron  saturation 20%; Ferritin 45. Proceed with venfoer today; add b12 injection.   # B12 def- Sublingual B12/ vit D-  add b12 injection.   # DISPOSITION:  # venofer  today; ADD b12 injection today- # follow up in 6 months- MD [1 week prior [facility]-cbc/ferritin/iron  studies/b12]- possible venofer ;   b12 injection  Dr.B

## 2024-06-11 ENCOUNTER — Ambulatory Visit: Admitting: Internal Medicine

## 2024-06-11 ENCOUNTER — Ambulatory Visit

## 2024-06-15 ENCOUNTER — Ambulatory Visit

## 2024-06-15 ENCOUNTER — Ambulatory Visit: Admitting: Internal Medicine

## 2024-07-05 ENCOUNTER — Other Ambulatory Visit (HOSPITAL_COMMUNITY): Payer: Self-pay

## 2024-07-05 ENCOUNTER — Telehealth: Payer: Self-pay

## 2024-07-05 NOTE — Telephone Encounter (Signed)
 Pharmacy Patient Advocate Encounter  Received notification from [employer] that Prior Authorization for  Wegovy  0.25MG /0.5ML auto-injectors  has been DENIED.  BECAUSE THE GAP OF THERAPY. I WAS ABLE TO COMPLETE NEW PRIOR WITH INS REP.    PA #/Case ID/Reference #: [id number]

## 2024-07-05 NOTE — Telephone Encounter (Signed)
 Pharmacy Patient Advocate Encounter   Received notification from CoverMyMeds that prior authorization for Wegovy  0.25MG /0.5ML auto-injectors is required/requested.   Insurance verification completed.   The patient is insured through [facility] .   Per test claim: PA required; PA submitted to above mentioned insurance via Phone Key/confirmation #/EOC [id number] Status is pending

## 2024-07-16 ENCOUNTER — Other Ambulatory Visit (HOSPITAL_COMMUNITY): Payer: Self-pay

## 2024-07-16 ENCOUNTER — Encounter: Payer: Self-pay | Admitting: Internal Medicine

## 2024-07-16 NOTE — Telephone Encounter (Signed)
 Pharmacy Patient Advocate Encounter  Received notification from [employer] that Prior Authorization for Wegovy  0.25MG /0.5ML auto-injectors has been APPROVED from 07/05/2024 to 07/05/2025   PA #/Case ID/Reference #: [id number]

## 2024-07-18 ENCOUNTER — Other Ambulatory Visit (HOSPITAL_COMMUNITY): Payer: Self-pay

## 2024-09-03 ENCOUNTER — Encounter: Payer: Self-pay | Admitting: Family Medicine

## 2024-09-05 ENCOUNTER — Other Ambulatory Visit: Payer: Self-pay | Admitting: Family Medicine

## 2024-09-05 DIAGNOSIS — D508 Other iron deficiency anemias: Secondary | ICD-10-CM

## 2024-09-05 DIAGNOSIS — Z131 Encounter for screening for diabetes mellitus: Secondary | ICD-10-CM

## 2024-09-05 DIAGNOSIS — Z13220 Encounter for screening for lipoid disorders: Secondary | ICD-10-CM

## 2024-09-05 DIAGNOSIS — E039 Hypothyroidism, unspecified: Secondary | ICD-10-CM

## 2024-09-05 NOTE — Telephone Encounter (Signed)
 Copied from CRM #[id number]. Topic: Clinical - Request for Lab/Test Order >> Sep 05, 2024  9:02 AM Kelly Barr wrote: Pt calling to request lab orders before her physical scheduled for 10/03. Please contact her when the orders are avail.   -iron ,  -TSH -B12  Anything else needed

## 2024-09-06 ENCOUNTER — Ambulatory Visit: Payer: Self-pay | Admitting: Family Medicine

## 2024-09-06 LAB — CBC WITH DIFFERENTIAL/PLATELET
Basophils Absolute: 0 x10E3/uL (ref 0.0–0.2)
Basos: 1 %
EOS (ABSOLUTE): 0.2 x10E3/uL (ref 0.0–0.4)
Eos: 3 %
Hematocrit: 42.9 % (ref 34.0–46.6)
Hemoglobin: 14.3 g/dL (ref 11.1–15.9)
Immature Grans (Abs): 0 x10E3/uL (ref 0.0–0.1)
Immature Granulocytes: 0 %
Lymphocytes Absolute: 1.7 x10E3/uL (ref 0.7–3.1)
Lymphs: 27 %
MCH: 31.6 pg (ref 26.6–33.0)
MCHC: 33.3 g/dL (ref 31.5–35.7)
MCV: 95 fL (ref 79–97)
Monocytes Absolute: 0.5 x10E3/uL (ref 0.1–0.9)
Monocytes: 8 %
Neutrophils Absolute: 3.8 x10E3/uL (ref 1.4–7.0)
Neutrophils: 61 %
Platelets: 297 x10E3/uL (ref 150–450)
RBC: 4.53 x10E6/uL (ref 3.77–5.28)
RDW: 12 % (ref 11.7–15.4)
WBC: 6.1 x10E3/uL (ref 3.4–10.8)

## 2024-09-06 LAB — VITAMIN B12: Vitamin B-12: 324 pg/mL (ref 232–1245)

## 2024-09-06 LAB — LIPID PANEL
Chol/HDL Ratio: 3 ratio (ref 0.0–4.4)
Cholesterol, Total: 195 mg/dL (ref 100–199)
HDL: 65 mg/dL (ref >39)
LDL Chol Calc (NIH): 117 mg/dL — ABNORMAL HIGH (ref 0–99)
Triglycerides: 72 mg/dL (ref 0–149)
VLDL Cholesterol Cal: 13 mg/dL (ref 5–40)

## 2024-09-06 LAB — T3, FREE: T3, Free: 2.1 pg/mL (ref 2.0–4.4)

## 2024-09-06 LAB — COMPREHENSIVE METABOLIC PANEL WITH GFR
ALT: 14 IU/L (ref 0–32)
AST: 20 IU/L (ref 0–40)
Albumin: 4.1 g/dL (ref 3.9–4.9)
Alkaline Phosphatase: 76 IU/L (ref 41–116)
BUN/Creatinine Ratio: 12 (ref 9–23)
BUN: 9 mg/dL (ref 6–24)
Bilirubin Total: 0.6 mg/dL (ref 0.0–1.2)
CO2: 21 mmol/L (ref 20–29)
Calcium: 8.9 mg/dL (ref 8.7–10.2)
Chloride: 103 mmol/L (ref 96–106)
Creatinine, Ser: 0.77 mg/dL (ref 0.57–1.00)
Globulin, Total: 2.7 g/dL (ref 1.5–4.5)
Glucose: 84 mg/dL (ref 70–99)
Potassium: 4.6 mmol/L (ref 3.5–5.2)
Sodium: 136 mmol/L (ref 134–144)
Total Protein: 6.8 g/dL (ref 6.0–8.5)
eGFR: 96 mL/min/1.73 (ref >59)

## 2024-09-06 LAB — IRON,TIBC AND FERRITIN PANEL
Ferritin: 35 ng/mL (ref 15–150)
Iron Saturation: 25 % (ref 15–55)
Iron: 84 ug/dL (ref 27–159)
Total Iron Binding Capacity: 336 ug/dL (ref 250–450)
UIBC: 252 ug/dL (ref 131–425)

## 2024-09-06 LAB — TSH: TSH: 6.71 u[IU]/mL — ABNORMAL HIGH (ref 0.450–4.500)

## 2024-09-06 LAB — HEMOGLOBIN A1C
Est. average glucose Bld gHb Est-mCnc: 97 mg/dL
Hgb A1c MFr Bld: 5 % (ref 4.8–5.6)

## 2024-09-06 LAB — T4, FREE: Free T4: 0.73 ng/dL — ABNORMAL LOW (ref 0.82–1.77)

## 2024-09-07 ENCOUNTER — Ambulatory Visit (INDEPENDENT_AMBULATORY_CARE_PROVIDER_SITE_OTHER): Admitting: Family Medicine

## 2024-09-07 ENCOUNTER — Encounter: Payer: Self-pay | Admitting: Family Medicine

## 2024-09-07 VITALS — BP 120/88 | HR 68 | Temp 98.1°F | Ht 66.5 in | Wt 347.5 lb

## 2024-09-07 DIAGNOSIS — F4321 Adjustment disorder with depressed mood: Secondary | ICD-10-CM | POA: Insufficient documentation

## 2024-09-07 DIAGNOSIS — Z1211 Encounter for screening for malignant neoplasm of colon: Secondary | ICD-10-CM

## 2024-09-07 DIAGNOSIS — F331 Major depressive disorder, recurrent, moderate: Secondary | ICD-10-CM | POA: Diagnosis not present

## 2024-09-07 DIAGNOSIS — E039 Hypothyroidism, unspecified: Secondary | ICD-10-CM | POA: Diagnosis not present

## 2024-09-07 MED ORDER — ALPRAZOLAM 0.25 MG PO TABS: 0.2500 mg | ORAL_TABLET | Freq: Two times a day (BID) | ORAL | 0 refills | Status: AC | PRN

## 2024-09-07 MED ORDER — ESCITALOPRAM OXALATE 10 MG PO TABS: 10.0000 mg | ORAL_TABLET | Freq: Every day | ORAL | 5 refills | Status: DC

## 2024-09-07 MED ORDER — LEVOTHYROXINE SODIUM 100 MCG PO TABS: 100.0000 ug | ORAL_TABLET | Freq: Every day | ORAL | 3 refills | Status: AC

## 2024-09-07 MED ORDER — WEGOVY 0.5 MG/0.5ML ~~LOC~~ SOAJ: 0.5000 mg | SUBCUTANEOUS | 3 refills | Status: DC

## 2024-09-07 NOTE — Assessment & Plan Note (Signed)
 Acute, in association with loss pf mother and greiving.  Will treat with restarting lexapro  but I have given her a short term course of alprazolam to use as needed in the interim. We have discussed that benzo use is not for long term.   Pt agreeable.

## 2024-09-07 NOTE — Progress Notes (Signed)
 Patient ID: Kelly Barr, female    DOB: Nov 21, 1976, 48 y.o.   MRN: [id number]  This visit was conducted in person.  BP 120/88   Pulse 68   Temp 98.1 F (36.7 C) (Temporal)   Ht 5' 6.5 (1.689 m)   Wt (!) 347 lb 8 oz (157.6 kg)   LMP 08/27/2024   SpO2 97%   BMI 55.25 kg/m    CC:  Chief Complaint  Patient presents with   Annual Exam    Subjective:   HPI: Kelly Barr is a 48 y.o. female presenting on 09/07/2024 for Annual Exam  Hypothyroid   she has not taken thyroid med in last year... inadequate control. Lab Results  Component Value Date   TSH 6.710 (H) 09/05/2024     On Wegovy  0.25 mg weekly Wt Readings from Last 3 Encounters:  09/07/24 (!) 347 lb 8 oz (157.6 kg)  05/23/24 (!) 359 lb 8 oz (163.1 kg)  05/09/24 (!) 355 lb 6.4 oz (161.2 kg)    Iron  def anemia , resolved with iron  infusions.  Secondary to  bariatric surgery 2006  Dr Amey   MDD in remission:  well controlled  She has lost her mother recently.. has funeral this weekend.. needs medication to help with overwhelm.  Sign up for grief support at work.  Elevated Cholesterol:  Lab Results  Component Value Date   CHOL 195 09/05/2024   HDL 65 09/05/2024   LDLCALC 117 (H) 09/05/2024   TRIG 72 09/05/2024   CHOLHDL 3.0 09/05/2024  The 10-year ASCVD risk score (Arnett DK, et al., 2019) is: 0.6%   Values used to calculate the score:     Age: 53 years     Clincally relevant sex: Female     Is Non-Hispanic African American: No     Diabetic: No     Tobacco smoker: No     Systolic Blood Pressure: 120 mmHg     Is BP treated: No     HDL Cholesterol: 65 mg/dL     Total Cholesterol: 195 mg/dL Using medications without problems: Muscle aches:  Diet compliance: moderate Exercise: none Other complaints:      Relevant past medical, surgical, family and social history reviewed and updated as indicated. Interim medical history since our last visit reviewed. Allergies and medications reviewed  and updated. Outpatient Medications Prior to Visit  Medication Sig Dispense Refill   albuterol  (VENTOLIN  HFA) 108 (90 Base) MCG/ACT inhaler Inhale 2 puffs into the lungs every 6 (six) hours as needed for wheezing or shortness of breath. 8 g 2   Multiple Vitamins-Minerals (MULTIVITAL PO) Take 2 tablets by mouth every evening. Barimets Multivitamin with Iron      Semaglutide -Weight Management (WEGOVY ) 0.25 MG/0.5ML SOAJ Inject 0.25 mg into the skin once a week. 6 mL 0   No facility-administered medications prior to visit.     Per HPI unless specifically indicated in ROS section below Review of Systems  Constitutional:  Negative for fatigue and fever.  HENT:  Negative for congestion.   Eyes:  Negative for pain.  Respiratory:  Negative for cough and shortness of breath.   Cardiovascular:  Negative for chest pain, palpitations and leg swelling.  Gastrointestinal:  Negative for abdominal pain.  Genitourinary:  Negative for dysuria and vaginal bleeding.  Musculoskeletal:  Negative for back pain.  Neurological:  Negative for syncope, light-headedness and headaches.  Psychiatric/Behavioral:  Negative for dysphoric mood.    Objective:  BP 120/88   Pulse  68   Temp 98.1 F (36.7 C) (Temporal)   Ht 5' 6.5 (1.689 m)   Wt (!) 347 lb 8 oz (157.6 kg)   LMP 08/27/2024   SpO2 97%   BMI 55.25 kg/m   Wt Readings from Last 3 Encounters:  09/07/24 (!) 347 lb 8 oz (157.6 kg)  05/23/24 (!) 359 lb 8 oz (163.1 kg)  05/09/24 (!) 355 lb 6.4 oz (161.2 kg)      Physical Exam Vitals and nursing note reviewed.  Constitutional:      General: She is not in acute distress.    Appearance: Normal appearance. She is well-developed. She is obese. She is not ill-appearing or toxic-appearing.  HENT:     Head: Normocephalic.     Right Ear: Hearing, tympanic membrane, ear canal and external ear normal.     Left Ear: Hearing, tympanic membrane, ear canal and external ear normal.     Nose: Nose normal.  Eyes:      General: Lids are normal. Lids are everted, no foreign bodies appreciated.     Conjunctiva/sclera: Conjunctivae normal.     Pupils: Pupils are equal, round, and reactive to light.  Neck:     Thyroid: No thyroid mass or thyromegaly.     Vascular: No carotid bruit.     Trachea: Trachea normal.  Cardiovascular:     Rate and Rhythm: Normal rate and regular rhythm.     Heart sounds: Normal heart sounds, S1 normal and S2 normal. No murmur heard.    No gallop.  Pulmonary:     Effort: Pulmonary effort is normal. No respiratory distress.     Breath sounds: Normal breath sounds. No wheezing, rhonchi or rales.  Abdominal:     General: Bowel sounds are normal. There is no distension or abdominal bruit.     Palpations: Abdomen is soft. There is no fluid wave or mass.     Tenderness: There is no abdominal tenderness. There is no guarding or rebound.     Hernia: No hernia is present.  Musculoskeletal:     Cervical back: Normal range of motion and neck supple.  Lymphadenopathy:     Cervical: No cervical adenopathy.  Skin:    General: Skin is warm and dry.     Findings: No rash.  Neurological:     Mental Status: She is alert.     Cranial Nerves: No cranial nerve deficit.     Sensory: No sensory deficit.  Psychiatric:        Mood and Affect: Mood is not anxious or depressed.        Speech: Speech normal.        Behavior: Behavior normal. Behavior is cooperative.        Judgment: Judgment normal.       Results for orders placed or performed in visit on 09/05/24  Lipid panel   Collection Time: 09/05/24 11:00 AM  Result Value Ref Range   Cholesterol, Total 195 100 - 199 mg/dL   Triglycerides 72 0 - 149 mg/dL   HDL 65 >60 mg/dL   VLDL Cholesterol Cal 13 5 - 40 mg/dL   LDL Chol Calc (NIH) 882 (H) 0 - 99 mg/dL   Chol/HDL Ratio 3.0 0.0 - 4.4 ratio  Comprehensive metabolic panel   Collection Time: 09/05/24 11:00 AM  Result Value Ref Range   Glucose 84 70 - 99 mg/dL   BUN 9 6 - 24 mg/dL    Creatinine, Ser 9.22 0.57 - 1.00 mg/dL  eGFR 96 >59 mL/min/1.73   BUN/Creatinine Ratio 12 9 - 23   Sodium 136 134 - 144 mmol/L   Potassium 4.6 3.5 - 5.2 mmol/L   Chloride 103 96 - 106 mmol/L   CO2 21 20 - 29 mmol/L   Calcium 8.9 8.7 - 10.2 mg/dL   Total Protein 6.8 6.0 - 8.5 g/dL   Albumin 4.1 3.9 - 4.9 g/dL   Globulin, Total 2.7 1.5 - 4.5 g/dL   Bilirubin Total 0.6 0.0 - 1.2 mg/dL   Alkaline Phosphatase 76 41 - 116 IU/L   AST 20 0 - 40 IU/L   ALT 14 0 - 32 IU/L  TSH   Collection Time: 09/05/24 11:00 AM  Result Value Ref Range   TSH 6.710 (H) 0.450 - 4.500 uIU/mL  Hemoglobin A1c   Collection Time: 09/05/24 11:00 AM  Result Value Ref Range   Hgb A1c MFr Bld 5.0 4.8 - 5.6 %   Est. average glucose Bld gHb Est-mCnc 97 mg/dL  T4, free   Collection Time: 09/05/24 11:00 AM  Result Value Ref Range   Free T4 0.73 (L) 0.82 - 1.77 ng/dL  CBC with Differential/Platelet   Collection Time: 09/05/24 11:00 AM  Result Value Ref Range   WBC 6.1 3.4 - 10.8 x10E3/uL   RBC 4.53 3.77 - 5.28 x10E6/uL   Hemoglobin 14.3 11.1 - 15.9 g/dL   Hematocrit 57.0 65.9 - 46.6 %   MCV 95 79 - 97 fL   MCH 31.6 26.6 - 33.0 pg   MCHC 33.3 31.5 - 35.7 g/dL   RDW 87.9 88.2 - 84.5 %   Platelets 297 150 - 450 x10E3/uL   Neutrophils 61 Not Estab. %   Lymphs 27 Not Estab. %   Monocytes 8 Not Estab. %   Eos 3 Not Estab. %   Basos 1 Not Estab. %   Neutrophils Absolute 3.8 1.4 - 7.0 x10E3/uL   Lymphocytes Absolute 1.7 0.7 - 3.1 x10E3/uL   Monocytes Absolute 0.5 0.1 - 0.9 x10E3/uL   EOS (ABSOLUTE) 0.2 0.0 - 0.4 x10E3/uL   Basophils Absolute 0.0 0.0 - 0.2 x10E3/uL   Immature Granulocytes 0 Not Estab. %   Immature Grans (Abs) 0.0 0.0 - 0.1 x10E3/uL  Iron , TIBC and Ferritin Panel   Collection Time: 09/05/24 11:00 AM  Result Value Ref Range   Total Iron  Binding Capacity 336 250 - 450 ug/dL   UIBC 747 868 - 574 ug/dL   Iron  84 27 - 159 ug/dL   Iron  Saturation 25 15 - 55 %   Ferritin 35 15 - 150 ng/mL  Vitamin  B12   Collection Time: 09/05/24 11:00 AM  Result Value Ref Range   Vitamin B-12 324 232 - 1,245 pg/mL  T3, free   Collection Time: 09/05/24 11:00 AM  Result Value Ref Range   T3, Free 2.1 2.0 - 4.4 pg/mL     COVID 19 screen:  No recent travel or known exposure to COVID19 The patient denies respiratory symptoms of COVID 19 at this time. The importance of social distancing was discussed today.   Assessment and Plan The patient's preventative maintenance and recommended screening tests for an annual wellness exam were reviewed in full today. Brought up to date unless services declined.  Counselled on the importance of diet, exercise, and its role in overall health and mortality. The patient's FH and SH was reviewed, including their home life, tobacco status, and drug and alcohol status.   HIV:  done  mammogram:  05/08/2024  Colon cancer screening: due  Vaccines: flu uptodate, uptodate Td...  consider Prevnar 20, hep b PAP:  Per GYN  at [facility] in 01/2022.  Hep C: done Problem List Items Addressed This Visit     Grief   Hypothyroidism - Primary   Elevated TSH, low T4  due to stopping levothyroxine  100 mcg daily in last year.  Restart levo 100 mcg daily  Recheck thyroid labs in 4- weeks.      Relevant Medications   levothyroxine  (SYNTHROID ) 100 MCG tablet   Other Relevant Orders   TSH   T3, Free   T4, free   MDD (major depressive disorder), recurrent episode, moderate (HCC)   Acute, in association with loss pf mother and greiving.  Will treat with restarting lexapro  but I have given her a short term course of alprazolam to use as needed in the interim. We have discussed that benzo use is not for long term.   Pt agreeable.      Relevant Medications   ALPRAZolam (XANAX) 0.25 MG tablet   escitalopram  (LEXAPRO ) 10 MG tablet   Morbid obesity (HCC)   Chronic, minimal weight loss with Wegovy  0.25 mg weekly.  Will increase to 0.5 mg weekly.  She will touch base in the next  month to see if we need to increase the medication further. Recommend continued healthy eating changes, lifestyle changes and regular exercise.      Relevant Medications   semaglutide -weight management (WEGOVY ) 0.5 MG/0.5ML SOAJ SQ injection   Other Visit Diagnoses       Colon cancer screening       Relevant Orders   Cologuard        Greig Ring, MD

## 2024-09-07 NOTE — Assessment & Plan Note (Signed)
 Chronic, minimal weight loss with Wegovy  0.25 mg weekly.  Will increase to 0.5 mg weekly.  She will touch base in the next month to see if we need to increase the medication further. Recommend continued healthy eating changes, lifestyle changes and regular exercise.

## 2024-09-07 NOTE — Assessment & Plan Note (Addendum)
 Elevated TSH, low T4  due to stopping levothyroxine  100 mcg daily in last year.  Restart levo 100 mcg daily  Recheck thyroid labs in 4- weeks.

## 2024-09-29 ENCOUNTER — Other Ambulatory Visit: Payer: Self-pay | Admitting: Family Medicine

## 2024-11-14 ENCOUNTER — Encounter: Payer: Self-pay | Admitting: Family Medicine

## 2024-11-14 NOTE — Addendum Note (Signed)
 Addended by: WENDELL ARLAND RAMAN on: 11/14/2024 04:38 PM   Modules accepted: Orders

## 2024-11-17 ENCOUNTER — Ambulatory Visit: Payer: Self-pay | Admitting: Family Medicine

## 2024-11-17 LAB — T3, FREE: T3, Free: 2.1 pg/mL (ref 2.0–4.4)

## 2024-11-17 LAB — T4, FREE: Free T4: 1.11 ng/dL (ref 0.82–1.77)

## 2024-11-17 LAB — TSH: TSH: 2.65 u[IU]/mL (ref 0.450–4.500)

## 2024-11-19 ENCOUNTER — Encounter: Payer: Self-pay | Admitting: Internal Medicine

## 2024-11-21 ENCOUNTER — Encounter: Payer: Self-pay | Admitting: Internal Medicine

## 2024-11-21 ENCOUNTER — Inpatient Hospital Stay: Attending: Internal Medicine | Admitting: Internal Medicine

## 2024-11-21 ENCOUNTER — Ambulatory Visit

## 2024-11-21 VITALS — BP 119/77 | HR 90 | Temp 97.9°F | Resp 16 | Ht 66.5 in | Wt 343.0 lb

## 2024-11-21 VITALS — BP 112/81

## 2024-11-21 DIAGNOSIS — D508 Other iron deficiency anemias: Secondary | ICD-10-CM | POA: Diagnosis not present

## 2024-11-21 DIAGNOSIS — K9589 Other complications of other bariatric procedure: Secondary | ICD-10-CM | POA: Diagnosis not present

## 2024-11-21 DIAGNOSIS — Z9884 Bariatric surgery status: Secondary | ICD-10-CM | POA: Insufficient documentation

## 2024-11-21 DIAGNOSIS — K909 Intestinal malabsorption, unspecified: Secondary | ICD-10-CM | POA: Insufficient documentation

## 2024-11-21 DIAGNOSIS — E559 Vitamin D deficiency, unspecified: Secondary | ICD-10-CM | POA: Insufficient documentation

## 2024-11-21 DIAGNOSIS — D509 Iron deficiency anemia, unspecified: Secondary | ICD-10-CM | POA: Diagnosis present

## 2024-11-21 MED ORDER — IRON SUCROSE 20 MG/ML IV SOLN
200.0000 mg | Freq: Once | INTRAVENOUS | Status: AC
  Administered 2024-11-21: 16:00:00 200 mg via INTRAVENOUS

## 2024-11-21 MED ORDER — ERGOCALCIFEROL 1.25 MG (50000 UT) PO CAPS: 50000.0000 [IU] | ORAL_CAPSULE | ORAL | 1 refills | Status: AC

## 2024-11-21 MED ORDER — CYANOCOBALAMIN 1000 MCG/ML IJ SOLN
1000.0000 ug | Freq: Once | INTRAMUSCULAR | Status: AC
  Administered 2024-11-21: 16:00:00 1000 ug via INTRAMUSCULAR
  Filled 2024-11-21: qty 1

## 2024-11-21 NOTE — Patient Instructions (Signed)
 Iron Sucrose Injection What is this medication? IRON SUCROSE (EYE ern SOO krose) treats low levels of iron (iron deficiency anemia) in people with kidney disease. Iron is a mineral that plays an important role in making red blood cells, which carry oxygen from your lungs to the rest of your body. This medicine may be used for other purposes; ask your health care provider or pharmacist if you have questions. COMMON BRAND NAME(S): Venofer What should I tell my care team before I take this medication? They need to know if you have any of these conditions: Anemia not caused by low iron levels Heart disease High levels of iron in the blood Kidney disease Liver disease An unusual or allergic reaction to iron, other medications, foods, dyes, or preservatives Pregnant or trying to get pregnant Breastfeeding How should I use this medication? This medication is infused into a vein. It is given by your care team in a hospital or clinic setting. Talk to your care team about the use of this medication in children. While it may be prescribed for children as young as 2 years for selected conditions, precautions do apply. Overdosage: If you think you have taken too much of this medicine contact a poison control center or emergency room at once. NOTE: This medicine is only for you. Do not share this medicine with others. What if I miss a dose? Keep appointments for follow-up doses. It is important not to miss your dose. Call your care team if you are unable to keep an appointment. What may interact with this medication? Do not take this medication with any of the following: Deferoxamine Dimercaprol Other iron products This medication may also interact with the following: Chloramphenicol Deferasirox This list may not describe all possible interactions. Give your health care provider a list of all the medicines, herbs, non-prescription drugs, or dietary supplements you use. Also tell them if you smoke,  drink alcohol, or use illegal drugs. Some items may interact with your medicine. What should I watch for while using this medication? Visit your care team for regular checks on your progress. Tell your care team if your symptoms do not start to get better or if they get worse. You may need blood work done while you are taking this medication. You may need to eat more foods that contain iron. Talk to your care team. Foods that contain iron include whole grains or cereals, dried fruits, beans, peas, leafy green vegetables, and organ meats (liver, kidney). What side effects may I notice from receiving this medication? Side effects that you should report to your care team as soon as possible: Allergic reactions--skin rash, itching, hives, swelling of the face, lips, tongue, or throat Low blood pressure--dizziness, feeling faint or lightheaded, blurry vision Shortness of breath Side effects that usually do not require medical attention (report to your care team if they continue or are bothersome): Flushing Headache Joint pain Muscle pain Nausea Pain, redness, or irritation at injection site This list may not describe all possible side effects. Call your doctor for medical advice about side effects. You may report side effects to FDA at 1-800-FDA-1088. Where should I keep my medication? This medication is given in a hospital or clinic. It will not be stored at home. NOTE: This sheet is a summary. It may not cover all possible information. If you have questions about this medicine, talk to your doctor, pharmacist, or health care provider.  2024 Elsevier/Gold Standard (2023-07-13 00:00:00)Vitamin B12 Injection What is this medication? Vitamin B12 (  VAHY tuh min B12) prevents and treats low vitamin B12 levels in your body. It is used in people who do not get enough vitamin B12 from their diet or when their digestive tract does not absorb enough. Vitamin B12 plays an important role in maintaining the  health of your nervous system and red blood cells. This medicine may be used for other purposes; ask your health care provider or pharmacist if you have questions. COMMON BRAND NAME(S): B-12 Compliance Kit, B-12 Injection Kit, Cyomin, Dodex, LA-12, Nutri-Twelve, Physicians EZ Use B-12, Primabalt, Vitamin Deficiency Injectable System - B12 What should I tell my care team before I take this medication? They need to know if you have any of these conditions: Kidney disease Leber's disease Megaloblastic anemia An unusual or allergic reaction to cyanocobalamin, cobalt, other medications, foods, dyes, or preservatives Pregnant or trying to get pregnant Breast-feeding How should I use this medication? This medication is injected into a muscle or deeply under the skin. It is usually given in a clinic or care team's office. However, your care team may teach you how to inject yourself. Follow all instructions. Talk to your care team about the use of this medication in children. Special care may be needed. Overdosage: If you think you have taken too much of this medicine contact a poison control center or emergency room at once. NOTE: This medicine is only for you. Do not share this medicine with others. What if I miss a dose? If you are given your dose at a clinic or care team's office, call to reschedule your appointment. If you give your own injections, and you miss a dose, take it as soon as you can. If it is almost time for your next dose, take only that dose. Do not take double or extra doses. What may interact with this medication? Alcohol Colchicine This list may not describe all possible interactions. Give your health care provider a list of all the medicines, herbs, non-prescription drugs, or dietary supplements you use. Also tell them if you smoke, drink alcohol, or use illegal drugs. Some items may interact with your medicine. What should I watch for while using this medication? Visit your  care team regularly. You may need blood work done while you are taking this medication. You may need to follow a special diet. Talk to your care team. Limit your alcohol intake and avoid smoking to get the best benefit. What side effects may I notice from receiving this medication? Side effects that you should report to your care team as soon as possible: Allergic reactions--skin rash, itching, hives, swelling of the face, lips, tongue, or throat Swelling of the ankles, hands, or feet Trouble breathing Side effects that usually do not require medical attention (report to your care team if they continue or are bothersome): Diarrhea This list may not describe all possible side effects. Call your doctor for medical advice about side effects. You may report side effects to FDA at 1-800-FDA-1088. Where should I keep my medication? Keep out of the reach of children. Store at room temperature between 15 and 30 degrees C (59 and 85 degrees F). Protect from light. Throw away any unused medication after the expiration date. NOTE: This sheet is a summary. It may not cover all possible information. If you have questions about this medicine, talk to your doctor, pharmacist, or health care provider.  2024 Elsevier/Gold Standard (2021-08-04 00:00:00)

## 2024-11-21 NOTE — Progress Notes (Signed)
 [facility] OFFICE PROGRESS NOTE  Patient Care Team: Avelina Greig BRAVO, MD as PCP - General Rennie Cindy SAUNDERS, MD as Consulting Physician (Oncology)   SUMMARY OF ONCOLOGIC HISTORY:  # 2006- IRON  DEFICIENCY ANEMIA sec Malabsorption/ gastric bypass, Roux-en-Y; IV Ferrahem every 6 months-maintenance; sublingual B12  #Hypothyroidism  INTERVAL HISTORY: 48 year-old female patient with a history of gastric bypass/malabsorption is currently IV Iron  every 4- 6 months is here for follow-up.  Discussed the use of AI scribe software for clinical note transcription with the patient, who gave verbal consent to proceed.  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  She has persistent anemia and nutritional deficiencies attributed to malabsorption after bariatric surgery. She is maintained on oral multivitamin supplementation (BariMelts), which has stabilized laboratory values over the past six months. Iron  saturation, ferritin, and B12 remain at the lower end of normal, while hemoglobin is within normal limits. She previously required more frequent injections when levels were lower, but now tolerates oral supplementation despite a mild aftertaste.  She describes ongoing fatigue, which she characterizes as a 'normal tired.' She recently resumed thyroid medication and notes improvement in energy and mood. She considered restarting Lexapro  due to sadness but opted to focus on thyroid management, which has been beneficial. She denies abnormal bleeding, bruising, or other acute symptoms. She remains active, working and caring for her four children.  Recent laboratory evaluation revealed a significantly low vitamin D  level. She is not currently taking a separate vitamin D   supplement and is unaware of prior vitamin D  testing.  She has experienced significant emotional stress following the recent death of her mother in 09-27-24, impacting her mood, particularly during the holiday season. She denies symptoms of major depression or anxiety beyond situational sadness.      Review of Systems  Constitutional:  Positive for malaise/fatigue. Negative for chills, diaphoresis, fever and weight loss.  HENT:  Negative for nosebleeds and sore throat.   Eyes:  Negative for double vision.  Respiratory:  Negative for cough, hemoptysis, sputum production, shortness of breath and wheezing.   Cardiovascular:  Negative for chest pain, palpitations, orthopnea and leg swelling.  Gastrointestinal:  Negative for abdominal pain, blood in stool, constipation, diarrhea, heartburn, melena, nausea and vomiting.  Genitourinary:  Negative for dysuria, frequency and urgency.  Musculoskeletal:  Negative for back pain and joint pain.  Neurological:  Negative for dizziness, tingling, focal weakness, weakness and headaches.  Endo/Heme/Allergies:  Does not bruise/bleed easily.  Psychiatric/Behavioral:  Negative for depression. The patient is not nervous/anxious and does not have insomnia.      PAST MEDICAL HISTORY :  Past Medical History:  Diagnosis Date   Allergy    Asthma    seasonal   Depression    History of 2019 novel coronavirus disease (COVID-19) 08/17/2022   Hypothyroidism    Iron  deficiency anemia    PONV (postoperative nausea and vomiting)    HARD TIME WAKING AFTER ENDOSCOPY   Postprandial hypoglycemia     PAST SURGICAL HISTORY :   Past Surgical History:  Procedure Laterality Date   CESAREAN SECTION     x2   CHONDROPLASTY Left 12/23/2017   Procedure: CHONDROPLASTY;  Surgeon: Tobie Priest, MD;  Location: [facility] OR[facility];  Service: Orthopedics;  Laterality: Left;   GASTRIC BYPASS  KNEE ARTHROSCOPY Left 12/23/2017   Procedure: ARTHROSCOPY KNEE WITH LOOSE BODY REMOVAL;   Surgeon: Tobie Priest, MD;  Location: [facility] OR[facility];  Service: Orthopedics;  Laterality: Left;   KNEE ARTHROSCOPY WITH SUBCHONDROPLASTY Right 10/19/2022   Procedure: Right knee arthroscopic loose body removal with chondroplasty of the patellofemoral and medial compartments;  Surgeon: Tobie Priest, MD;  Location: [facility];  Service: Orthopedics;  Laterality: Right;    FAMILY HISTORY :   Family History  Problem Relation Age of Onset   Arthritis Mother    Cervical cancer Mother    Alcoholism Paternal Grandfather    Stomach cancer Paternal Grandfather    Heart disease Maternal Grandfather    Diabetes Maternal Grandmother    Breast cancer Neg Hx     SOCIAL HISTORY:   Social History   Tobacco Use   Smoking status: Never   Smokeless tobacco: Never  Vaping Use   Vaping status: Never Used  Substance Use Topics   Alcohol use: No    Comment: occ   Drug use: No    ALLERGIES:  is allergic to morphine and codeine.  MEDICATIONS:  Current Outpatient Medications  Medication Sig Dispense Refill   albuterol  (VENTOLIN  HFA) 108 (90 Base) MCG/ACT inhaler Inhale 2 puffs into the lungs every 6 (six) hours as needed for wheezing or shortness of breath. 8 g 2   ergocalciferol  (VITAMIN D2) 1.25 MG (50000 UT) capsule Take 1 capsule (50,000 Units total) by mouth once a week. 12 capsule 1   levothyroxine  (SYNTHROID ) 100 MCG tablet Take 1 tablet (100 mcg total) by mouth daily. 90 tablet 3   Multiple Vitamins-Minerals (MULTIVITAL PO) Take 2 tablets by mouth every evening. Barimets Multivitamin with Iron      Semaglutide -Weight Management (WEGOVY ) 0.25 MG/0.5ML SOAJ Inject 0.25 mg into the skin once a week. 6 mL 0   semaglutide -weight management (WEGOVY ) 0.5 MG/0.5ML SOAJ SQ injection Inject 0.5 mg into the skin once a week. 2 mL 3   ALPRAZolam  (XANAX ) 0.25 MG tablet Take 1 tablet (0.25 mg total) by mouth 2 (two) times daily as needed for anxiety. (Patient not taking: Reported on 11/21/2024) 20 tablet 0    escitalopram  (LEXAPRO ) 10 MG tablet TAKE 1 TABLET BY MOUTH EVERY DAY (Patient not taking: Reported on 11/21/2024) 90 tablet 1   No current facility-administered medications for this visit.   Facility-Administered Medications Ordered in Other Visits  Medication Dose Route Frequency Provider Last Rate Last Admin   cyanocobalamin  (VITAMIN B12) injection 1,000 mcg  1,000 mcg Intramuscular Once Alisi Lupien R, MD       iron  sucrose (VENOFER ) injection 200 mg  200 mg Intravenous Once Onofre Gains R, MD        PHYSICAL EXAMINATION: ECOG PERFORMANCE STATUS: 1 - Symptomatic but completely ambulatory  BP 119/77 (BP Location: Left Arm, Patient Position: Sitting, Cuff Size: Large)   Pulse 90   Temp 97.9 F (36.6 C) (Tympanic)   Resp 16   Ht 5' 6.5 (1.689 m)   Wt (!) 343 lb (155.6 kg)   SpO2 98%   BMI 54.53 kg/m   Filed Weights   11/21/24 1504  Weight: (!) 343 lb (155.6 kg)     Physical Exam HENT:     Head: Normocephalic and atraumatic.     Mouth/Throat:     Pharynx: No oropharyngeal exudate.  Eyes:     Pupils: Pupils are equal, round, and reactive to light.  Cardiovascular:     Rate and Rhythm: Normal rate and regular  rhythm.  Pulmonary:     Effort: No respiratory distress.     Breath sounds: No wheezing.  Abdominal:     General: Bowel sounds are normal. There is no distension.     Palpations: Abdomen is soft. There is no mass.     Tenderness: There is no abdominal tenderness. There is no guarding or rebound.  Musculoskeletal:        General: No tenderness. Normal range of motion.     Cervical back: Normal range of motion and neck supple.  Skin:    General: Skin is warm.  Neurological:     Mental Status: She is alert and oriented to person, place, and time.  Psychiatric:        Mood and Affect: Affect normal.      LABORATORY DATA:  I have reviewed the data as listed    Component Value Date/Time   NA 136 09/05/2024 1100   NA 137 04/25/2014 1534   K  4.6 09/05/2024 1100   K 4.7 04/25/2014 1534   CL 103 09/05/2024 1100   CL 106 04/25/2014 1534   CO2 21 09/05/2024 1100   CO2 22 04/25/2014 1534   GLUCOSE 84 09/05/2024 1100   GLUCOSE 90 04/25/2014 1534   BUN 9 09/05/2024 1100   BUN 11 04/25/2014 1534   CREATININE 0.77 09/05/2024 1100   CREATININE 0.67 04/25/2014 1534   CALCIUM 8.9 09/05/2024 1100   CALCIUM 8.1 (L) 04/25/2014 1534   PROT 6.8 09/05/2024 1100   ALBUMIN 4.1 09/05/2024 1100   AST 20 09/05/2024 1100   ALT 14 09/05/2024 1100   ALKPHOS 76 09/05/2024 1100   BILITOT 0.6 09/05/2024 1100   GFRNONAA 115 12/21/2019 1625   GFRNONAA >60 04/25/2014 1534   GFRAA 132 12/21/2019 1625   GFRAA >60 04/25/2014 1534    No results found for: SPEP, UPEP  Lab Results  Component Value Date   WBC 6.1 09/05/2024   NEUTROABS 3.8 09/05/2024   HGB 14.3 09/05/2024   HCT 42.9 09/05/2024   MCV 95 09/05/2024   PLT 297 09/05/2024      Chemistry      Component Value Date/Time   NA 136 09/05/2024 1100   NA 137 04/25/2014 1534   K 4.6 09/05/2024 1100   K 4.7 04/25/2014 1534   CL 103 09/05/2024 1100   CL 106 04/25/2014 1534   CO2 21 09/05/2024 1100   CO2 22 04/25/2014 1534   BUN 9 09/05/2024 1100   BUN 11 04/25/2014 1534   CREATININE 0.77 09/05/2024 1100   CREATININE 0.67 04/25/2014 1534   GLU 129 07/25/2019 0000      Component Value Date/Time   CALCIUM 8.9 09/05/2024 1100   CALCIUM 8.1 (L) 04/25/2014 1534   ALKPHOS 76 09/05/2024 1100   AST 20 09/05/2024 1100   ALT 14 09/05/2024 1100   BILITOT 0.6 09/05/2024 1100         ASSESSMENT & PLAN:   Iron  deficiency anemia following bariatric surgery # 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   barimelt+ iron    # Proceed with venfoer today; add b12 injection.   # B12 def- Sublingual B12/ vit D-  add b12 injection.   # vit D def- DEC 2025- 22- recommend vit D 50K/weekly; x3 m-there  after take over-the-counter 2000 units daily  # DISPOSITION:  # venofer  today; ADD b12 injection today- # follow up in 6 months- MD [1 week prior [facility]c/ferritin/iron   studies/b12; vit D 25-OH]- possible venofer ;   b12 injection  Dr.B     Cindy JONELLE Joe, MD 11/21/2024 3:37 PM

## 2024-11-21 NOTE — Progress Notes (Signed)
 Fatigue/weakness: YES Dyspena: NO Light headedness: AT TIMES Blood in stool: NO   Lost mom 2 months ago.  Pt asking if she needs to be taking vit d?

## 2024-11-21 NOTE — Assessment & Plan Note (Addendum)
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   barimelt+ iron    # Proceed with venfoer today; add b12 injection.   # B12 def- Sublingual B12/ vit D-  add b12 injection.   # vit D def- DEC 2025- 22- recommend vit D 50K/weekly; x3 m-there after take over-the-counter 2000 units daily  # DISPOSITION:  # venofer  today; ADD b12 injection today- # follow up in 6 months- MD [1 week prior [facility]c/ferritin/iron  studies/b12; vit D 25-OH]- possible venofer ;   b12 injection  Dr.B

## 2024-12-10 ENCOUNTER — Other Ambulatory Visit (HOSPITAL_COMMUNITY): Payer: Self-pay

## 2024-12-28 ENCOUNTER — Other Ambulatory Visit: Payer: Self-pay | Admitting: *Deleted

## 2024-12-28 ENCOUNTER — Other Ambulatory Visit: Payer: Self-pay | Admitting: Family Medicine

## 2024-12-28 MED ORDER — WEGOVY 1 MG/0.5ML ~~LOC~~ SOAJ: 1.0000 mg | SUBCUTANEOUS | 0 refills | Status: AC

## 2024-12-28 NOTE — Telephone Encounter (Signed)
 Per last office note patient was to reach out on how she is doing on changed dose. I have sent my chart message.

## 2024-12-28 NOTE — Telephone Encounter (Signed)
 I think patient is wanting to go up to next dose.  Should it be 1 mg now?

## 2024-12-28 NOTE — Addendum Note (Signed)
 Addended by: WENDELL ARLAND RAMAN on: 12/28/2024 12:19 PM   Modules accepted: Orders

## 2025-05-22 ENCOUNTER — Inpatient Hospital Stay: Admitting: Internal Medicine

## 2025-05-22 ENCOUNTER — Inpatient Hospital Stay
